# Patient Record
Sex: Male | Born: 1990
Health system: Southern US, Community
[De-identification: ages and names within clinical notes are randomized; demographics above are authoritative.]

## PROBLEM LIST (undated history)

## (undated) DIAGNOSIS — M459 Ankylosing spondylitis of unspecified sites in spine: Secondary | ICD-10-CM

---
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
## 2013-07-29 ENCOUNTER — Telehealth: Payer: Self-pay

## 2013-07-29 NOTE — Telephone Encounter (Signed)
PT'S MOTHER CALLED AND WANTS TO KNOW HOW LONG HER SON NEEDS TO TAKE MALARIA PILLS BEFORE HE LEAVES FOR HIS TRIP TO [place]. Fuller Song RHONDA @ [phone]

## 2013-07-29 NOTE — Telephone Encounter (Signed)
Please advise 

## 2013-07-30 NOTE — Telephone Encounter (Signed)
We have not seen this patient in Epic

## 2013-07-31 NOTE — Telephone Encounter (Signed)
He has not been seen in our office since 10/21/11. Who prescribed medication?

## 2013-07-31 NOTE — Telephone Encounter (Signed)
Left message to return call 

## 2013-08-27 ENCOUNTER — Ambulatory Visit (INDEPENDENT_AMBULATORY_CARE_PROVIDER_SITE_OTHER): Payer: BC Managed Care – PPO | Admitting: Family Medicine

## 2013-08-27 ENCOUNTER — Encounter: Payer: Self-pay | Admitting: Family Medicine

## 2013-08-27 VITALS — BP 118/64 | HR 61 | Temp 98.9°F | Resp 16 | Ht 72.75 in | Wt 181.6 lb

## 2013-08-27 DIAGNOSIS — Z7184 Encounter for health counseling related to travel: Secondary | ICD-10-CM

## 2013-08-27 DIAGNOSIS — Z91030 Bee allergy status: Secondary | ICD-10-CM

## 2013-08-27 DIAGNOSIS — Z7189 Other specified counseling: Secondary | ICD-10-CM

## 2013-08-27 DIAGNOSIS — Z23 Encounter for immunization: Secondary | ICD-10-CM

## 2013-08-27 MED ORDER — CIPROFLOXACIN HCL 500 MG PO TABS: 500.0000 mg | ORAL_TABLET | Freq: Two times a day (BID) | ORAL | Status: DC

## 2013-08-27 MED ORDER — DOXYCYCLINE HYCLATE 100 MG PO CAPS: 100.0000 mg | ORAL_CAPSULE | Freq: Every day | ORAL | Status: DC

## 2013-08-27 MED ORDER — EPINEPHRINE 0.3 MG/0.3ML IJ SOAJ: 0.3000 mg | Freq: Once | INTRAMUSCULAR | Status: DC

## 2013-08-27 MED ORDER — TYPHOID VACCINE PO CPDR: 1.0000 | DELAYED_RELEASE_CAPSULE | ORAL | Status: DC

## 2013-08-27 NOTE — Patient Instructions (Addendum)
Your typhoid vaccine was sent to [address] in the [facility] shopping center [phone].  Take 1 tab of the typhoid vaccine every other day for 4 doses (so will take 7days to complete).  It is effective 1 week after completing course.  Take 1 hour before a meal (on an empty stomach) with a cold or lukewarm drink.  This lasts for about 5 years.  For the Japenese encephalitis vaccine - Call [facility] - the travel clinic in [place] - [phone] to ask if they have it as well as call the [facility] immunization clinic [phone] (normally they have a long wait but maybe have a cancellation next wk).  If you find a [facility] or other pharmacy that has the vaccine, just call us and I will call you in a prescription for it - however, the [facility] [address] we [facility] did not have it.  You have completed your hepatitis B vaccination series and after today you have completed your hepatitis A series as well.  All of your other routine immunizations are up to date.  Start taking the doxycycline for malaria prophylaxis 1-2 days before you leave. Take every day - it will increase your sensitivity to the sun.  Continue taking it until after you have returned home for 4 weeks.  I sent a prescription for ciprofloxacin to your pharmacy.  This is for you to take with you and hold on to. If you get severe abd pains and diarrhea that do not resolve within 1-2 days, try the cipro.  Travelers' Diarrhea Travelers' diarrhea (TD) is the most common illness affecting travelers. Each year many travelers develop diarrhea. TD usually occurs within the first week of travel. However, it may occur at any time while traveling. It may even occur after returning home. The most important risk factor is where you are going. High-risk places are the developing countries of:  [place].  [place].  The [place].  [place]. High risk people include young adults and those  with:  Transplants.  HIV infections.  Medicine that suppresses the immune system.  Inflammatory-bowel disease.  Diabetes.  H-2 blockers or antacids. Attack rates are similar for men and women. The primary source of TD is eating or drinking food or water tainted with feces (stool or bowel movements). CAUSES  Infectious agents are the primary cause of TD. Germs cause almost 80% of TD cases. The most common germ produces:  Watery diarrhea with cramps.  Low-grade or no fever. There are many other bacterial, viral and parasitic pathogens (disease causing "bugs").  SYMPTOMS  Most TD cases begin suddenly. Symptoms include stool that is increased in:  Frequency.  Volume.  Weight. Altered stool consistency also is common. Typically, you have four to five loose or watery bowel movements each day. Other common symptoms are:  Nausea.  Vomiting.  Diarrhea.  Abdominal cramping.  Bloating.  Fever.  Urgency.  Malaise. Most cases are not dangerous. Most cases go away in 1-2 days without treatment. TD is rarely life threatening. 90% of cases resolve within 1 week. 98% resolve within 1 month. PREVENTION   Avoid foods or beverages purchased from street vendors in high risk countries.  Avoid food from places where unclean conditions are present.  Avoid raw or undercooked meat and seafood.  Avoid raw fruits (e.g., oranges, bananas, avocados) and vegetables unless you peel them yourself.  If handled properly, well-cooked and packaged foods usually are safe. Foods associated with  increased risk for TD include:  Tap water.  Ice.  Unpasteurized milk.  Dairy products.  Safe beverages include:  Bottled carbonated beverages.  Hot tea or coffee.  Beer.  Wine.  Boiled water.  Water treated with iodine or chlorine. ANTIBIOTICS ARE NOT RECOMMENDED AS PREVENTION  CDC (Centers for Disease Control) does not recommend antimicrobial drugs (medicine that kill germs) to  prevent TD. Several studies show that Pepto-Bismol taken as either 2 tablets 4 times daily, or 2 fluid ounces 4 times daily, reduces the incidence of travelers' diarrhea. People that should avoid Pepto-Bismol include those who are:  Pregnant.  Allergic to aspirin.  Taking anticoagulants medicine (probenecid, methotrexate).  Be informed about potential side effects, in particular about temporary blackening of the tongue and stool, and rarely ringing in the ears. Because of potential adverse side effects, preventative Pepto-Bismol should not be used for more than 3 weeks.  Some antibiotics taken in a once-a-day dose are 90% effective at preventing travelers' diarrhea. However, antibiotics are not recommended as prevention. Routine antimicrobial prophylaxis increases your risk for:  Adverse reactions.  Infections with resistant organisms.  Antibiotics can increase your susceptibility to resistant bacterial pathogens and provide no protection against either viral or parasitic pathogens. This can give travelers a false sense of security. As a result, strict adherence to preventive measures is encouraged. Pepto-Bismol should be used as an extra effort if prophylaxis is needed. TREATMENT   TD usually is a self-limited disorder. It gets well without treatment. It often goes away without specific treatment. Oral re-hydration is often helpful to replace lost fluids and electrolytes. Clear liquids are routinely recommended for adults. You may be helped with antimicrobial therapy if you develop three or more loose stools in an 8-hour period, especially if associated with:  Nausea.  Vomiting.  Abdominal cramps.  Fever.  Blood in stools.  Antibiotics usually are given for 3-5 days. Pepto-Bismol also may be used as treatment. Take one fluid ounce, or two 262 mg tablets every 30 minutes, for up to 8 doses in a 24-hour period. This can be repeated on a second day. If diarrhea persists despite  therapy, you should be evaluated by a caregiver and treated for possible parasitic infection.  Because drug resistance is a continuing problem and may vary from country to country, professional assistance should be looked for if problems persist.  Antimotility agents (loperamide, diphenoxylate, and paregoric) mostly reduce diarrhea by slowing down the passage of food and drink in the gut. This allows more time for absorption. Some persons believe diarrhea is the body's defense mechanism to minimize contact time between gut pathogens and lining of the bowel. In several studies, antimotility agents have been useful in treating travelers' diarrhea by decreasing the duration of diarrhea. However, these agents should never be used by persons with fever or bloody diarrhea because they can increase the severity of disease by delaying clearance of causative organisms. Because antimotility agents are now available over the counter, their improper use is of concern. Complications have been reported from the use of these medicines such as:  Toxic megacolon.  Sepsis.  Disseminated intravascular coagulation. SEEK IMMEDIATE MEDICAL CARE IF:   You are unable to keep fluids down.  Vomiting or diarrhea becomes persistent.  Abdominal (belly) pain develops or increases or localizes. (Right sided pain can be appendicitis and left sided pain in adults can be diverticulitis).  You develop an oral temperature above 102 F (38.9 C), or as your caregiver suggests.  Diarrhea becomes excessive  or contains blood or mucous.  Excessive weakness, dizziness, fainting or extreme thirst.  Checking weight 2 to 3 times per day in babies and children will help verify adequate fluid replacement. Your caregiver will tell you what loss should concern you or suggest another visit to your personal physician.  Record your weight or your child's weight today. Compare this to your home scale and record all weights and time and date  weighed. Try to check weight at the same times every day. Bring this chart to your caregivers if you or your child needs to be seen again. FOR MORE INFORMATION  Travelers should consult with a caregiver before departing on a trip abroad. Information about TD is available from:  Your local or state health departments.  World Science writer [facility]). Other information that may be of interest to travelers can be found at the [facility] Travelers' Health homepage at QuestDrive.gl. Document Released: 12/06/2002 Document Revised: 03/09/2012 Document Reviewed: 02/23/2009 [facility] Patient Information 2014 [place], [place].  Immunization Information for Foreign Travel Immunizations can protect you from certain diseases. Immunizations can also prevent the spread of certain infections. It is important to see your caregiver or a travel medicine specialist 4 6 weeks before you travel. This allows time for vaccines to take effect. It also provides enough time for you to get vaccines that must be given in a series over a period of days or weeks. Immunizations for travelers include:  Routine vaccines. These vaccines are standard for the people in a country.  Recommended vaccines. These vaccines are recommended before travel to some countries or regions.  Required vaccines. These vaccines are necessary before travel to specific countries or regions. If it is less than 4 weeks before you leave, you should still see your caregiver. You might still benefit from vaccines or medicines. WHAT ARE THE ROUTINE VACCINES? Routine vaccines can protect you from diseases that are common in many parts of the world. Most routine vaccines are given at specific ages during your life. However, routine vaccines also include the annual flu (influenza) vaccine. You should be up-to-date on your routine immunizations before you travel. Your caregiver will be able to review your vaccine history and determine whether you have had  all the routine vaccines. You may be advised to get extra doses or booster vaccines even if you are up-to-date on the routine vaccines. WHAT ARE THE RECOMMENDED VACCINES? Know your travel schedule when you visit your caregiver. The vaccines recommended before foreign travel will depend on several factors, including:  The country or countries of travel.  Whether you will travel to rural areas.  The length of time you will be traveling.  The season of the year.  Your age.  Your health status.  Your previous immunizations. Vaccine recommendations change over time. Your caregiver can tell you what vaccines are recommended before your trip. The annual influenza vaccine sometimes differs for the [place] and [place] hemispheres. Unless the annual vaccines are the same in both hemispheres, people with certain chronic medical conditions who are traveling to the other hemisphere shortly before or during the influenza season should also get the other influenza vaccine. The other influenza vaccine should be obtained either before leaving the country or shortly after arrival at the travel site. WHAT ARE THE REQUIRED VACCINES? Vaccines may be required during a current outbreak of an infectious disease in a country or region. Your caregiver will be able to tell you about any current outbreaks and required vaccines. For example, proof of yellow  fever immunization is currently required for most people before traveling to certain countries in [place] and [place]. This vaccine can only be obtained at approved centers. You should get the yellow fever vaccine at least 10 days before your trip. After 10 days, most people show immunity to yellow fever. If it has been longer than 10 years since you received the yellow fever vaccine, another dose is required. If proof of immunization is incomplete or inaccurate, you could be quarantined, denied entry, or given another dose of vaccine at the travel site. If you  cannot receive the yellow fever vaccine because of medical reasons, you must have a written statement from your caregiver. The statement must contain a medical reason for the lack of immunization. In such a case, your caregiver should then give you advice on how to decrease your chance of getting yellow fever. That advice should include taking precautions to avoid mosquito bites and limiting outdoor time. Other than having a medical condition or being under the age of 6 months, no other reasons will be accepted for not getting the vaccine.  Proof of meningococcal immunization is required by the [place] of Health for any person older than 2 years who is taking part in the [place] or [place]. Visas for traveling to the hajj or [place] will not even be issued until there is proof of immunization. You should get this vaccine at least 10 days before your trip. After 10 days, most people show immunity. If it has been longer than 3 years since your last immunization, another dose is required. FOR MORE INFORMATION  Centers for Disease Control and Prevention (CDC): FootballExhibition.com.br  World Health Organization [facility]): https://castaneda-walker.com/ Document Released: 12/04/2009 Document Revised: 12/02/2012 Document Reviewed: 11/13/2012 [facility] Patient Information 2014 [place], [place].

## 2013-08-27 NOTE — Progress Notes (Signed)
Subjective:    Patient ID: David Frye, male    DOB: [birth year], 22 y.o.   MRN: [id number] Chief Complaint  Patient presents with  . Annual Exam    HPI  Traveling to [place] - leaving on 9/8 for 90 d on his own.  Doesn't really have a plan - just going to travel and explore.  Quit his job recently to do this.  Had significant swelling with a bee sting previously so had been rx'ed an epipen which expired and he never had to use. It was mainly given to him in case he was stung on the neck or face.  Requests renewal off this.  History reviewed. No pertinent past medical history. No current outpatient prescriptions on file prior to visit.   No current facility-administered medications on file prior to visit.   No Known Allergies History reviewed. No pertinent past surgical history. History reviewed. No pertinent family history. History   Social History  . Marital Status: Married    Spouse Name: N/A    Number of Children: N/A  . Years of Education: N/A   Occupational History  . Food Industry    Social History Main Topics  . Smoking status: Former Games developer  . Smokeless tobacco: None  . Alcohol Use: Yes     Comment: 15 drinks a week  . Drug Use: No  . Sexual Activity: Yes   Other Topics Concern  . None   Social History Narrative   Single. Education: [employer]. Exercise: Biking.     Review of Systems  Constitutional: Negative.   HENT: Negative.   Eyes: Negative.   Respiratory: Negative.   Cardiovascular: Negative.   Gastrointestinal: Negative.   Endocrine: Negative.   Genitourinary: Negative.   Musculoskeletal: Negative.   Skin: Negative.   Allergic/Immunologic: Negative.   Neurological: Negative.   Hematological: Negative.   Psychiatric/Behavioral: Negative.       BP 118/64  Pulse 61  Temp(Src) 98.9 F (37.2 C) (Oral)  Resp 16  Ht 6' 0.75" (1.848 m)  Wt 181 lb 9.6 oz (82.373 kg)  BMI 24.12 kg/m2  SpO2 100% Objective:   Physical Exam   Constitutional: He is oriented to person, place, and time. He appears well-developed and well-nourished. No distress.  HENT:  Head: Normocephalic and atraumatic.  Eyes: No scleral icterus.  Pulmonary/Chest: Effort normal.  Neurological: He is alert and oriented to person, place, and time.  Skin: Skin is warm and dry. He is not diaphoretic.  Psychiatric: He has a normal mood and affect. His behavior is normal.      Assessment & Plan:  Travel advice encounter Malaria prophylaxis with doxy 1d before and for 28d after returning. Start oral typhoid vaccine now - see pt instructions Cipro prn traveler's diarrhea Last Td was 2005 so TdaP given today Flu shot today 2nd Hep A today - first was given in 2007 so course now complete, has completed all 3 doses for hep B in 2005 and 2 doses of varicella.  Had meningococcal vaccine around 2007. Call other clinics to try to get Japanese encephalitis vaccine.  Bee sting allergy - epipen refilled. Meds ordered this encounter  Medications  . EPINEPHrine (EPIPEN) 0.3 mg/0.3 mL SOAJ injection    Sig: Inject 0.3 mLs (0.3 mg total) into the muscle once.    Dispense:  1 Device    Refill:  1  . doxycycline (VIBRAMYCIN) 100 MG capsule    Sig: Take 1 capsule (100 mg total) by mouth daily.  Dispense:  120 capsule    Refill:  0  . DISCONTD: typhoid (VIVOTIF BERNA VACCINE) DR capsule    Sig: Take 1 capsule by mouth every other day.    Dispense:  4 capsule    Refill:  0  . typhoid (VIVOTIF BERNA VACCINE) DR capsule    Sig: Take 1 capsule by mouth every other day.    Dispense:  4 capsule    Refill:  0  . ciprofloxacin (CIPRO) 500 MG tablet    Sig: Take 1 tablet (500 mg total) by mouth 2 (two) times daily. For 5 days    Dispense:  20 tablet    Refill:  0

## 2016-10-17 ENCOUNTER — Ambulatory Visit (INDEPENDENT_AMBULATORY_CARE_PROVIDER_SITE_OTHER): Payer: BC Managed Care – PPO | Admitting: Physician Assistant

## 2016-10-17 ENCOUNTER — Ambulatory Visit (INDEPENDENT_AMBULATORY_CARE_PROVIDER_SITE_OTHER): Payer: BC Managed Care – PPO

## 2016-10-17 ENCOUNTER — Encounter: Payer: Self-pay | Admitting: Physician Assistant

## 2016-10-17 VITALS — BP 116/74 | HR 62 | Temp 97.8°F | Resp 17 | Ht 72.75 in | Wt 206.0 lb

## 2016-10-17 DIAGNOSIS — M25561 Pain in right knee: Secondary | ICD-10-CM

## 2016-10-17 DIAGNOSIS — M7052 Other bursitis of knee, left knee: Secondary | ICD-10-CM | POA: Diagnosis not present

## 2016-10-17 MED ORDER — MELOXICAM 7.5 MG PO TABS: 7.5000 mg | ORAL_TABLET | Freq: Every day | ORAL | 1 refills | Status: DC

## 2016-10-17 NOTE — Progress Notes (Signed)
David Frye  MRN: [id number] DOB: 1991-12-16  PCP: No primary care provider on file.  Subjective:  Pt is a 25 year old male presents to clinic for left knee swelling x three days. Pain is located on the outside of his left knee, pain does not radiate. Hurts more with squatting. Does not feel unstable. No history of MOI, no history of sports. He likes to hike.  He iced it and elevated it last night, which helped.  He is going out of the country for an indefinite amount of time and would like to make sure there is nothing wrong with it.   Pain when squats down.   Notes he stopped eating dairy three weeks ago. He started eating it again a few days ago, which is when the pain returned.   Review of Systems  Constitutional: Negative for activity change, chills, diaphoresis, fatigue and fever.  Respiratory: Negative for cough, chest tightness and shortness of breath.   Cardiovascular: Negative for chest pain and palpitations.  Gastrointestinal: Negative for diarrhea, nausea and vomiting.  Genitourinary: Negative for discharge, dysuria and flank pain.  Musculoskeletal: Positive for arthralgias (left knee) and joint swelling (left knee). Negative for gait problem.  Skin: Negative for color change, rash and wound.  Neurological: Negative for dizziness, light-headedness and headaches.    There are no active problems to display for this patient.   No current outpatient prescriptions on file prior to visit.   No current facility-administered medications on file prior to visit.     No Known Allergies  Objective:  BP 116/74 (BP Location: Right Arm, Patient Position: Sitting, Cuff Size: Large)   Pulse 62   Temp 97.8 F (36.6 C) (Oral)   Resp 17   Ht 6' 0.75" (1.848 m)   Wt 206 lb (93.4 kg)   SpO2 99%   BMI 27.37 kg/m   Physical Exam  Constitutional: He is oriented to person, place, and time and well-developed, well-nourished, and in no distress. No distress.  Cardiovascular:  Normal rate, regular rhythm and normal heart sounds.   Pulmonary/Chest: Effort normal. No respiratory distress.  Musculoskeletal:       Left knee: He exhibits effusion (mild, lateral left knee. No patella ballottment ). He exhibits normal range of motion, no deformity, normal alignment, normal meniscus and no MCL laxity. No medial joint line, no lateral joint line, no MCL, no LCL and no patellar tendon tenderness noted.  Neurological: He is alert and oriented to person, place, and time. GCS score is 15.  Skin: Skin is warm and dry.  Psychiatric: Mood, memory, affect and judgment normal.  Vitals reviewed.  Dg Knee Complete 4 Views Left  Result Date: 10/17/2016 CLINICAL DATA:  Knee pain.  No known injury.  Initial evaluation. EXAM: LEFT KNEE - COMPLETE 4+ VIEW COMPARISON:  No recent prior. FINDINGS: No acute bony or joint abnormality identified. No evidence of fracture or dislocation. IMPRESSION: No acute abnormality. Electronically Signed   By: Maisie Fus  Register   On: 10/17/2016 09:13    Assessment and Plan :  1. Right knee pain, unspecified chronicity 2. Bursitis of left knee, unspecified bursa - DG Knee Complete 4 Views Left - meloxicam (MOBIC) 7.5 MG tablet; Take 1 tablet (7.5 mg total) by mouth daily.  Dispense: 30 tablet; Refill: 1 - Supportive care: Rest, ice, compression and elevation. Discussed with patient trial of no dairy, as he thinks there is a possible association. RTC if no improvement/worksening symptoms. Consider imaging at that time.  Marco CollieWhitney Anice Wilshire, PA-C  [facility] 10/17/2016 8:17 AM

## 2016-10-17 NOTE — Patient Instructions (Addendum)
If you continue to experience knee swelling, please use rest, ice, compression with an ace wrap and elevation. Mobic is an anti-inflammatory medication. You can take one or two, depending on how you feel. One in the morning and one at night; or two in the morning or at night. This will help you reduce inflammation.  Try keeping off the dairy. This can cause joint pain and swelling in some patients.    Thank you for coming in today. I hope you feel we met your needs.  Feel free to call [facility] if you have any questions or further requests.  Please consider signing up for MyChart if you do not already have it, as this is a great way to communicate with me.  Best,  Whitney McVey, PA-C  IF you received an x-ray today, you will receive an invoice from [facility]. Please contact [facility] at [phone] with questions or concerns regarding your invoice.   IF you received labwork today, you will receive an invoice from [employer]. Please contact [facility] at [phone] with questions or concerns regarding your invoice.   Our billing staff will not be able to assist you with questions regarding bills from these companies.  You will be contacted with the lab results as soon as they are available. The fastest way to get your results is to activate your My Chart account. Instructions are located on the last page of this paperwork. If you have not heard from [place] regarding the results in 2 weeks, please contact this office.     HOME CARE INSTRUCTIONS  Take medicines only as directed by your health care provider.  Rest your knee and keep it raised (elevated) while you are resting.  Do not do things that cause or worsen pain.  Avoid high-impact activities or exercises, such as running, jumping rope, or doing jumping jacks.  Apply ice to the knee area:  Put ice in a plastic bag.  Place a towel between your skin and the bag.  Leave the ice on for 20  minutes, 2-3 times a day.  Ask your health care provider if you should wear an elastic knee support.  Keep a pillow under your knee when you sleep.  Lose weight if you are overweight. Extra weight can put pressure on your knee.  Do not use any tobacco products, including cigarettes, chewing tobacco, or electronic cigarettes. If you need help quitting, ask your health care provider. Smoking may slow the healing of any bone and joint problems that you may have. SEEK MEDICAL CARE IF:  Your knee pain continues, changes, or gets worse.  You have a fever along with knee pain.  Your knee buckles or locks up.  Your knee becomes more swollen. SEEK IMMEDIATE MEDICAL CARE IF:   Your knee joint feels hot to the touch.  You have chest pain or trouble breathing.   This information is not intended to replace advice given to you by your health care provider. Make sure you discuss any questions you have with your health care provider.

## 2017-03-11 ENCOUNTER — Ambulatory Visit (INDEPENDENT_AMBULATORY_CARE_PROVIDER_SITE_OTHER): Payer: BC Managed Care – PPO

## 2017-03-11 ENCOUNTER — Ambulatory Visit (INDEPENDENT_AMBULATORY_CARE_PROVIDER_SITE_OTHER): Payer: BC Managed Care – PPO | Admitting: Family Medicine

## 2017-03-11 VITALS — BP 118/58 | HR 64 | Temp 97.5°F | Ht 72.75 in | Wt 178.4 lb

## 2017-03-11 DIAGNOSIS — M533 Sacrococcygeal disorders, not elsewhere classified: Secondary | ICD-10-CM | POA: Diagnosis not present

## 2017-03-11 DIAGNOSIS — M25472 Effusion, left ankle: Secondary | ICD-10-CM

## 2017-03-11 DIAGNOSIS — M25462 Effusion, left knee: Secondary | ICD-10-CM

## 2017-03-11 DIAGNOSIS — M25449 Effusion, unspecified hand: Secondary | ICD-10-CM | POA: Diagnosis not present

## 2017-03-11 DIAGNOSIS — M2550 Pain in unspecified joint: Secondary | ICD-10-CM

## 2017-03-11 DIAGNOSIS — W57XXXS Bitten or stung by nonvenomous insect and other nonvenomous arthropods, sequela: Secondary | ICD-10-CM | POA: Diagnosis not present

## 2017-03-11 MED ORDER — MELOXICAM 7.5 MG PO TABS: 7.5000 mg | ORAL_TABLET | Freq: Every day | ORAL | 1 refills | Status: DC

## 2017-03-11 NOTE — Progress Notes (Signed)
Subjective:  By signing my name below, I, David Frye, attest that this documentation has been prepared under the direction and in the presence of David StaggersJeffrey Joseph Johns, MD. Electronically Signed: Stann Oresung-Kai Frye, Scribe. 03/11/2017 , 12:01 PM .  Patient was seen in Room 8 .   Patient ID: David Frye, male    DOB: 07/08/1991, 26 y.o.   MRN: [id number] Chief Complaint  Patient presents with  . Knee Pain    X 4 mth left knee pain   HPI David Frye is a 26 y.o. male  Patient reports having left knee issues and radiates up to his left hip and down his left ankle. He states this issue started in October 2017, with left knee swelling. He denies any slips or falls that stood out in his memory. He went to [place] the past 4 [place], and worked on a pearl farm everyday on a boat, being on his feet daily. He hasn't rested much. In January, he had fluid aspirated from his left knee and fluid tested, with results showing no crystals or gout in fluid. He also had a MRI done in January, with results looking normal without injuries. He was just informed there was inflammation without any true tears. He has MRI result on a CD with him today. He noticed the swelling in his knee was reduced after the fluid was withdrawn.   Left Knee He reports his knee is painful and stiff, as well as limping, but improves as the day goes on. He has difficulty bending down or squatting down fully. He is able to stand and walk without difficulty. He does note knee giving way once only. He's taken meloxicam in October for a month but without any relief. He also received another month's worth in January. He's been taking advil and that seemed to help the pain more than meloxicam. He hasn't taken advil in a week.   Left ankle His left ankle has been sore, roughly within a month after knee issue started, with very light swelling. His left hip soreness started when his ankle issue started. He denies any ankle sprains in  the past. He denies doing any sports in the past. He's skateboarded when he was younger.   Fingers He also reports his finger joints hurting and feeling slightly swollen after working on the pearl farm. 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 He would wear gloves when cleaning, but it would go through the gloves and would still cause some swelling.   Ticks His family dog also had Lyme disease. He's been bit by ticks while [place], but without noticing any bulls-eye rash or other rash.   There are no active problems to display for this patient.  History reviewed. No pertinent past medical history. History reviewed. No pertinent surgical history. No Known Allergies Prior to Admission medications   Medication Sig Start Date End Date Taking? Authorizing Provider  meloxicam (MOBIC) 7.5 MG tablet Take 1 tablet (7.5 mg total) by mouth daily. 10/17/16   Madelaine BhatElizabeth Whitney McVey, PA-C   Social History   Social History  . Marital status: Married    Spouse name: N/A  . Number of children: N/A  . Years of education: N/A   Occupational History  . Food Industry    Social History Main Topics  . Smoking status: Former Games developermoker  . Smokeless tobacco: Never Used  . Alcohol use Yes  Comment: 15 drinks a week  . Drug use: No  . Sexual activity: Yes   Other Topics Concern  . Not on file   Social History Narrative   Single. Education: [employer]. Exercise: Biking.   Review of Systems  Constitutional: Negative for fatigue and unexpected weight change.  Eyes: Negative for visual disturbance.  Respiratory: Negative for cough, chest tightness and shortness of breath.   Cardiovascular: Negative for chest pain, palpitations and leg swelling.  Gastrointestinal: Negative for abdominal pain and blood in stool.  Musculoskeletal: Positive for arthralgias (left knee pain) and joint swelling. Negative for gait problem and  myalgias.  Neurological: Negative for dizziness, light-headedness and headaches.       Objective:   Physical Exam  Constitutional: He is oriented to person, place, and time. He appears well-developed and well-nourished. No distress.  HENT:  Head: Normocephalic and atraumatic.  Eyes: EOM are normal. Pupils are equal, round, and reactive to light.  Neck: Neck supple.  Cardiovascular: Normal rate.   Pulmonary/Chest: Effort normal. No respiratory distress.  Musculoskeletal: Normal range of motion.  Hands: multiple areas of joint swelling of the PIP's 3rd, 4th and 5th of both hands, IP of the left thumb, there is some DIP swelling of the 2nd 3rd and 4th on the right, 2nd 3rd 4th and 5th on the left, there is some lack of extension of the 5th phalanx PIP bilaterally, ROM equal at other joints Left ankle: some swelling/effusion of left ankle, no bony tenderness, ROM intact, NVI distally Left Knee: 2+ effusion, slight warmth and tenderness over patellar greater lateral than medial, full extension, 90 degrees of flexion, negative varus, negative valgus, negative lachman, pain with mcmurray testing Right knee: full ROM, minimal crepitance, negative lachman, negative mcmurray Left hip: tender over left SI joint, lumbar joint non tender  Neurological: He is alert and oriented to person, place, and time.  Skin: Skin is warm and dry.  Psychiatric: He has a normal mood and affect. His behavior is normal.  Nursing note and vitals reviewed.   Vitals:   03/11/17 1111 03/11/17 1116  BP: (!) 116/56 (!) 118/58  Pulse: 64   Temp: 97.5 F (36.4 C)   TempSrc: Oral   SpO2: 99%   Weight: 178 lb 6.4 oz (80.9 kg)   Height: 6' 0.75" (1.848 m)   Dg Si Joints  Result Date: 03/11/2017 CLINICAL DATA:  SACROILIAC PAIN. EXAM: RIGHT SACROILIAC JOINT - 1+ VIEW COMPARISON:  None. FINDINGS: The sacroiliac joint space IS maintained and there is no evidence of arthropathy. No other bone abnormalities are seen.  IMPRESSION: Negative. Electronically Signed   By: Signa Kell M.D.   On: 03/11/2017 13:16   Dg Knee Complete 4 Views Left  Result Date: 03/11/2017 CLINICAL DATA:  Left knee pain. EXAM: LEFT KNEE - COMPLETE 4+ VIEW COMPARISON:  10/17/2016 FINDINGS: Joint spaces are maintained. No joint effusion. No acute bony abnormality. Specifically, no fracture, subluxation, or dislocation. Soft tissues are intact. IMPRESSION: No acute bony abnormality. Electronically Signed   By: Charlett Nose M.D.   On: 03/11/2017 12:38        Assessment & Plan:   Ashrith Sagan is a 26 y.o. male Polyarthralgia Swelling of ankle joint, left  Swelling of hand joint, unspecified laterality Tick bite, sequela - Plan: Lyme Ab/Western Blot Reflex Sacro ilial pain - Plan: DG Si Joints Swelling of joint, knee, left   - Plan: Rheumatoid factor, Uric acid, Sedimentation rate, Antistreptolysin O titer, DG Knee Complete 4  Views Left, Comprehensive metabolic panel, Lyme Ab/Western Blot Reflex, C-reactive protein, CANCELED: Cyclic Citrul Peptide Antibody, IGG, CANCELED: ANA, IFA Comprehensive Panel  - Initially left knee swelling without known injury, reportedly had negative workup with aspiration for crystals and MRI without acute findings. Now with multiple other joint involvement including left ankle, SI pain, and multiple inter-phalangeal joints of bilateral hands. Symmetrical characteristics suspicious for rheumatoid disease. However he does give history of previous tick bite. Lyme arthritis also in differential. X-ray of SI joints and knee without acute findings. Did have some slight dry skin, without other typical rash of psoriasis. However psoriatic arthritis also in differential.  - Check Lyme titer, then Western blot for confirmation if needed.  -Check rheumatoid factor, ANA, sedimentation rate, ASO titer, CRP for other rheumatologic causes.  -Anticipate referral to rheumatology, but will proceed with initial workup as  above.  -Meloxicam 7.5-15 mg daily when necessary. No work note needed at this time.    Meds ordered this encounter  Medications  . meloxicam (MOBIC) 7.5 MG tablet    Sig: Take 1-2 tablets (7.5-15 mg total) by mouth daily.    Dispense:  30 tablet    Refill:  1   Patient Instructions    I will check for various causes of swollen joints. Depending on the lab results, we'll likely refer you to rheumatology. For now can take meloxicam 1-2 pills per day. Do not combine this with any other over-the-counter anti-inflammatories such as ibuprofen, but okay to additionally take Tylenol if needed.   Return to the clinic or go to the nearest emergency room if any of your symptoms worsen or new symptoms occur.    IF you received an x-ray today, you will receive an invoice from [facility]. Please contact [facility] at [phone] with questions or concerns regarding your invoice.   IF you received labwork today, you will receive an invoice from [place]. Please contact [facility] at [phone] with questions or concerns regarding your invoice.   Our billing staff will not be able to assist you with questions regarding bills from these companies.  You will be contacted with the lab results as soon as they are available. The fastest way to get your results is to activate your My Chart account. Instructions are located on the last page of this paperwork. If you have not heard from [place] regarding the results in 2 weeks, please contact this office.       I personally performed the services described in this documentation, which was scribed in my presence. The recorded information has been reviewed and considered for accuracy and completeness, addended by me as needed, and agree with information above.  Signed,   David Staggers, MD Primary Care at [facility].  03/12/17 1:08 PM

## 2017-03-11 NOTE — Patient Instructions (Addendum)
  I will check for various causes of swollen joints. Depending on the lab results, we'll likely refer you to rheumatology. For now can take meloxicam 1-2 pills per day. Do not combine this with any other over-the-counter anti-inflammatories such as ibuprofen, but okay to additionally take Tylenol if needed.   Return to the clinic or go to the nearest emergency room if any of your symptoms worsen or new symptoms occur.    IF you received an x-ray today, you will receive an invoice from [facility]. Please contact [facility] at [phone] with questions or concerns regarding your invoice.   IF you received labwork today, you will receive an invoice from [facility]. Please contact [facility] at [phone] with questions or concerns regarding your invoice.   Our billing staff will not be able to assist you with questions regarding bills from these companies.  You will be contacted with the lab results as soon as they are available. The fastest way to get your results is to activate your My Chart account. Instructions are located on the last page of this paperwork. If you have not heard from us regarding the results in 2 weeks, please contact this office.

## 2017-03-13 LAB — COMPREHENSIVE METABOLIC PANEL
ALBUMIN: 4.6 g/dL (ref 3.5–5.5)
ALK PHOS: 85 IU/L (ref 39–117)
ALT: 10 IU/L (ref 0–44)
AST: 9 IU/L (ref 0–40)
Albumin/Globulin Ratio: 1.6 (ref 1.2–2.2)
BILIRUBIN TOTAL: 0.4 mg/dL (ref 0.0–1.2)
BUN / CREAT RATIO: 14 (ref 9–20)
BUN: 11 mg/dL (ref 6–20)
CHLORIDE: 98 mmol/L (ref 96–106)
CO2: 26 mmol/L (ref 18–29)
Calcium: 9.7 mg/dL (ref 8.7–10.2)
Creatinine, Ser: 0.76 mg/dL (ref 0.76–1.27)
GFR calc Af Amer: 146 mL/min/{1.73_m2} (ref >59)
GFR calc non Af Amer: 126 mL/min/{1.73_m2} (ref >59)
GLOBULIN, TOTAL: 2.9 g/dL (ref 1.5–4.5)
Glucose: 79 mg/dL (ref 65–99)
Potassium: 4.2 mmol/L (ref 3.5–5.2)
SODIUM: 140 mmol/L (ref 134–144)
Total Protein: 7.5 g/dL (ref 6.0–8.5)

## 2017-03-13 LAB — LYME AB/WESTERN BLOT REFLEX: LYME DISEASE AB, QUANT, IGM: <0.8 index (ref 0.00–0.79)

## 2017-03-13 LAB — C-REACTIVE PROTEIN: CRP: 25.1 mg/L — ABNORMAL HIGH (ref 0.0–4.9)

## 2017-03-13 LAB — ANTISTREPTOLYSIN O TITER: ASO: 59 IU/mL (ref 0.0–200.0)

## 2017-03-13 LAB — ANA: ANA TITER 1: NEGATIVE

## 2017-03-13 LAB — URIC ACID: URIC ACID: 6 mg/dL (ref 3.7–8.6)

## 2017-03-13 LAB — SEDIMENTATION RATE: Sed Rate: 20 mm/hr — ABNORMAL HIGH (ref 0–15)

## 2017-03-13 LAB — RHEUMATOID FACTOR

## 2017-03-13 LAB — CYCLIC CITRUL PEPTIDE ANTIBODY, IGG/IGA: CYCLIC CITRULLIN PEPTIDE AB: 3 U (ref 0–19)

## 2017-03-14 ENCOUNTER — Other Ambulatory Visit: Payer: Self-pay | Admitting: Family Medicine

## 2017-03-14 DIAGNOSIS — M2550 Pain in unspecified joint: Secondary | ICD-10-CM

## 2017-03-19 ENCOUNTER — Encounter: Payer: Self-pay | Admitting: Family Medicine

## 2018-03-05 IMAGING — DX DG KNEE COMPLETE 4+V*L*
4 series · 4 of 4 positions shown · non-contrast
Comparison: 10/17/2016

CLINICAL DATA: Left knee pain.

EXAM:
LEFT KNEE - COMPLETE 4+ VIEW

[knee ap]
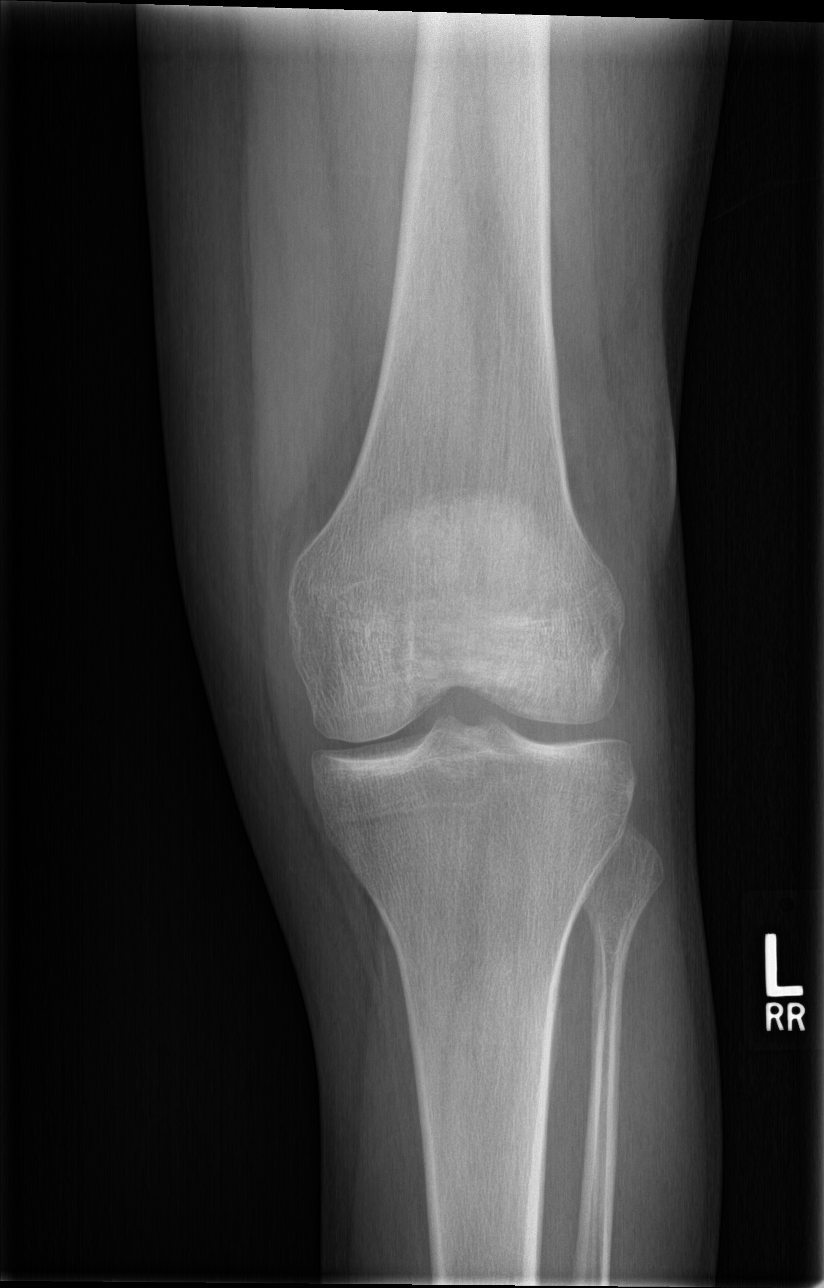

[knee obl (1 of 2)]
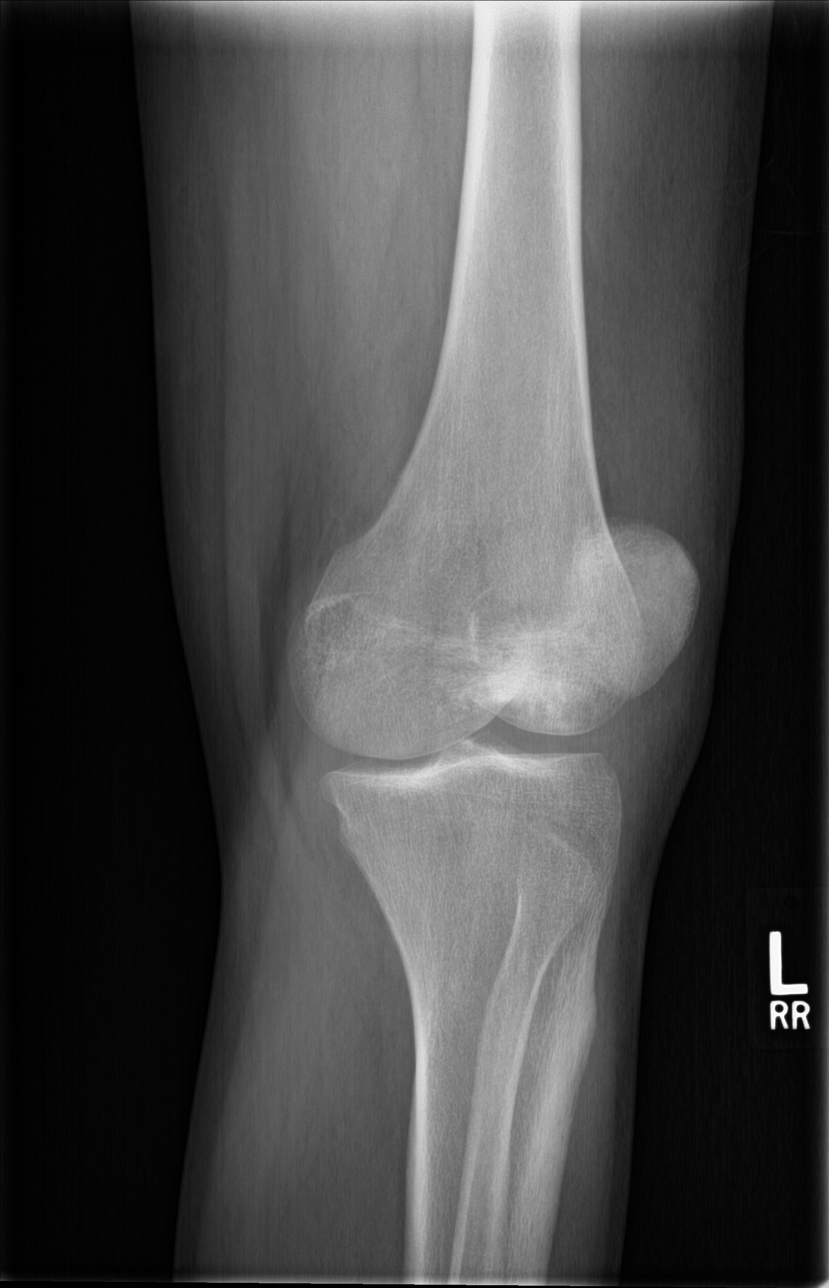

[knee obl (2 of 2)]
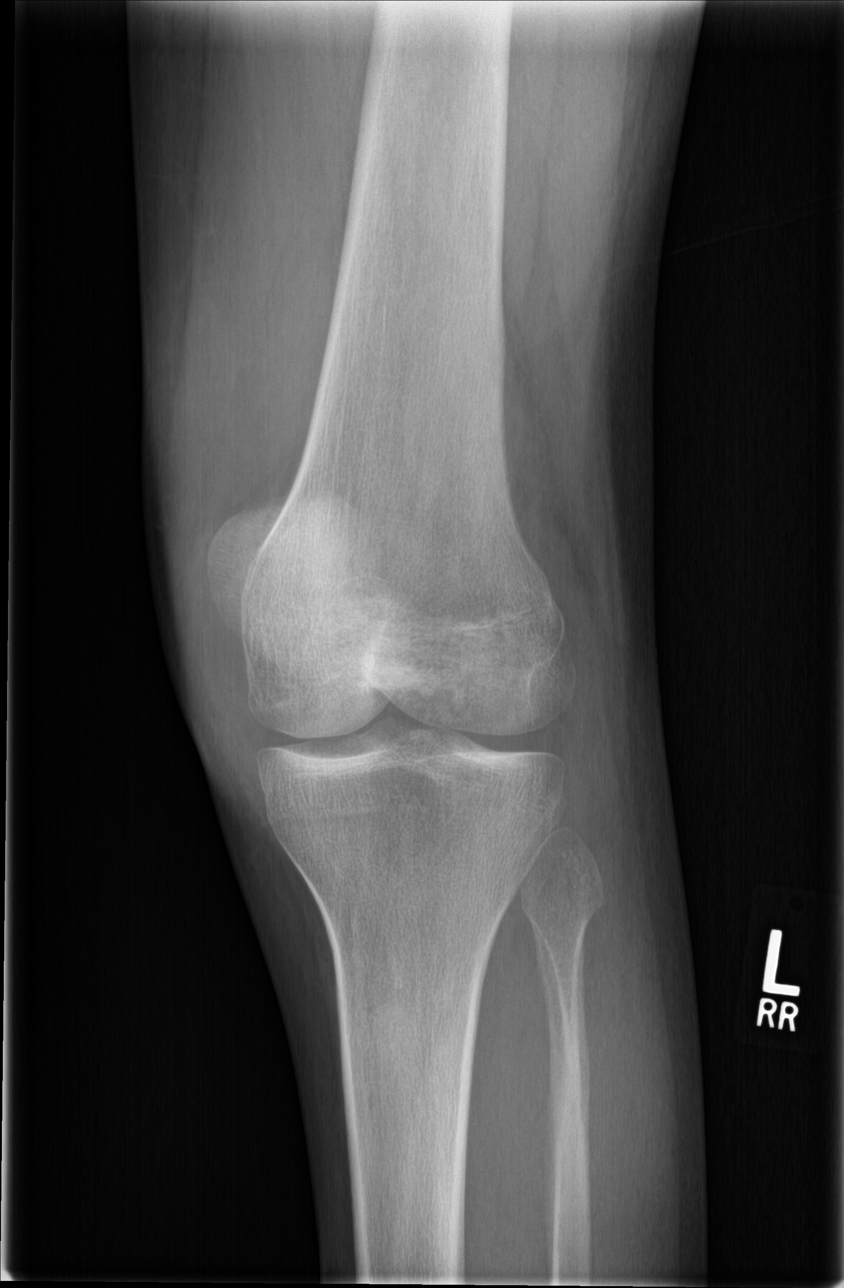

[knee lat]
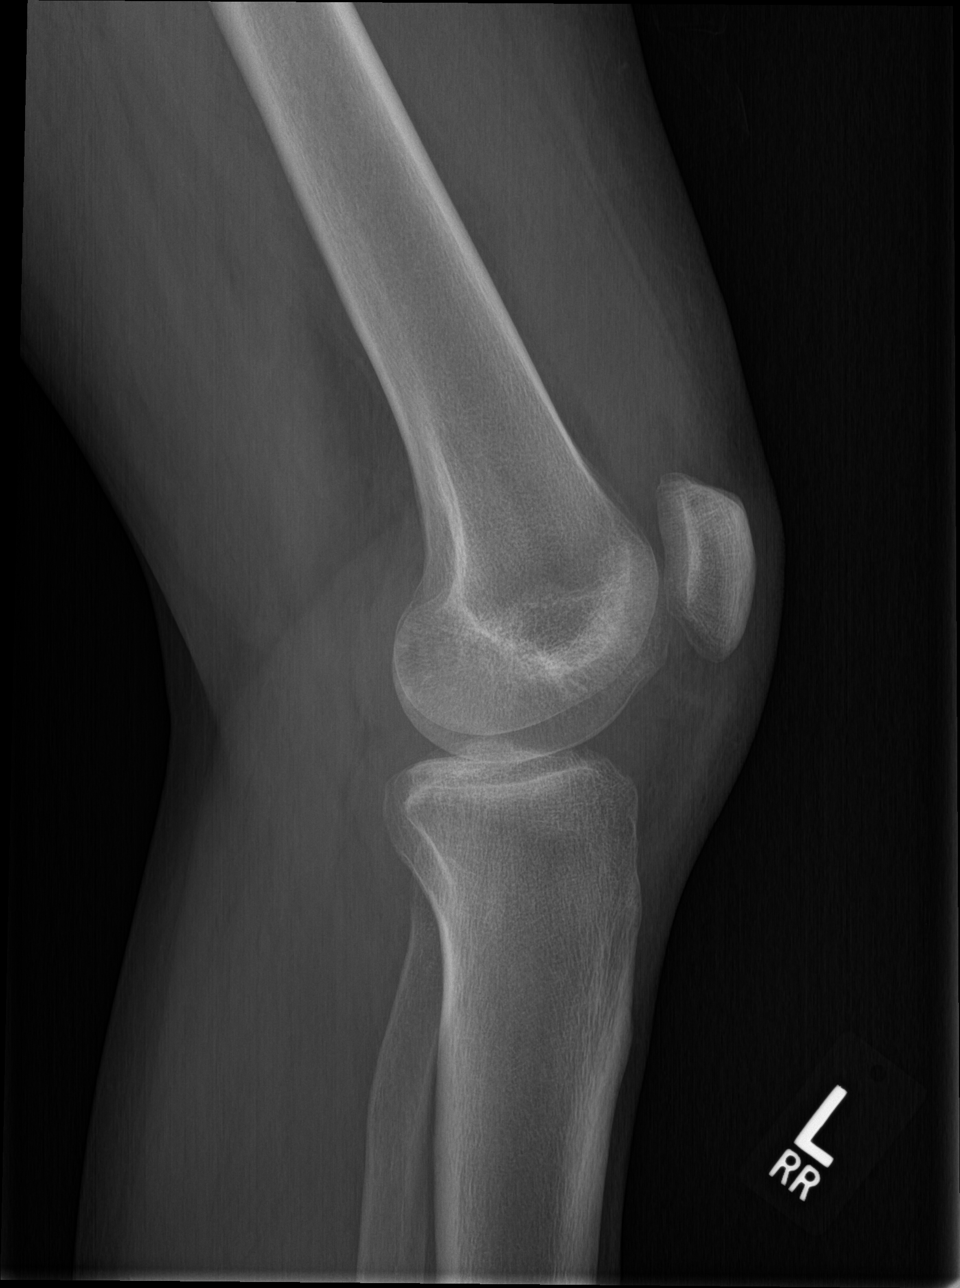

[4 of 4 positions shown; findings below may reference images not displayed]

FINDINGS: Joint spaces are maintained. No joint effusion. No acute bony
abnormality. Specifically, no fracture, subluxation, or dislocation.
Soft tissues are intact.
IMPRESSION: No acute bony abnormality.

## 2018-03-16 ENCOUNTER — Other Ambulatory Visit: Payer: Self-pay

## 2018-03-16 ENCOUNTER — Encounter: Payer: Self-pay | Admitting: Family Medicine

## 2018-03-16 ENCOUNTER — Ambulatory Visit: Payer: BLUE CROSS/BLUE SHIELD | Admitting: Family Medicine

## 2018-03-16 VITALS — BP 124/62 | HR 59 | Temp 98.2°F | Resp 18 | Ht 72.75 in | Wt 177.6 lb

## 2018-03-16 DIAGNOSIS — M47899 Other spondylosis, site unspecified: Secondary | ICD-10-CM | POA: Diagnosis not present

## 2018-03-16 NOTE — Patient Instructions (Addendum)
I would discuss diet effect on the the HLA B27 spondyloarthropathy, and options for meds with rheumatology. I am happy to refer you to another rheumatologist to get another opinion.  Let me know who you would like to see by Mychart and I will send in a referral.    IF you received an x-ray today, you will receive an invoice from [facility]. Please contact [facility] at [phone] with questions or concerns regarding your invoice.   IF you received labwork today, you will receive an invoice from [place]. Please contact [facility] at [phone] with questions or concerns regarding your invoice.   Our billing staff will not be able to assist you with questions regarding bills from these companies.  You will be contacted with the lab results as soon as they are available. The fastest way to get your results is to activate your My Chart account. Instructions are located on the last page of this paperwork. If you have not heard from [place] regarding the results in 2 weeks, please contact this office.

## 2018-03-16 NOTE — Progress Notes (Signed)
Subjective:  By signing my name below, I, David Frye, attest that this documentation has been prepared under the direction and in the presence of David Agreste, MD Electronically Signed: Ladene Frye, David Frye 03/16/2018 at 4:58 PM.   Patient ID: David Frye, male    DOB: 1991/10/26, 27 y.o.   MRN: [id number]  Chief Complaint  Patient presents with  . questions    would like to discuss an auto immune disease he was dx with    HPI David Frye is a 27 y.o. male who presents to Primary Care at [facility] for medical advice. Pt has been followed by Dr. 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 Dad has a h/o psoriasis.  Pt states that he is concerned since he was never diagnosed with anything specific and Dr. Lenna Gilford never looked into what was causing the reaction, he was just told to take Humira for the rest of his life which has a lot of side-effects.  Pt states that he did a lot of research on autoimmune diseases which suggested that decreasing stress and a "clean diet" without diary or gluten could improve his symptoms. Pt states that he was doing Whole 30 for 60 days prior to his reaction. He then had pizza after completing his 60 days and woke up the following day with l ankle swelling. Pt states that he then went to [place] and ate pizza again and noticed even more swelling. 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 Also states that he saw a physician in [place] and was given NSAIDs. Over the past 4 weeks, he has gone on an "allergen free diet" as he is interested in stopping Humira. He is due for another injection today. Pt also states that he has not had dairy in a yr but still had some swelling with eating gluten, even with being on Humira. He denies joint swelling or pain, even with exercising frequently.  There are no active  problems to display for this patient.  No past medical history on file. No past surgical history on file. No Known Allergies Prior to Admission medications   Medication Sig Start Date End Date Taking? Authorizing Provider  HUMIRA PEN 40 MG/0.8ML PNKT  02/25/18  Yes [provider]  meloxicam (MOBIC) 7.5 MG tablet Take 1-2 tablets (7.5-15 mg total) by mouth daily. Patient not taking: Reported on 03/16/2018 03/11/17   David Agreste, MD   Social History   Socioeconomic History  . Marital status: Married    Spouse name: Not on file  . Number of children: Not on file  . Years of education: Not on file  . Highest education level: Not on file  Social Needs  . Financial resource strain: Not on file  . Food insecurity - worry: Not on file  . Food insecurity - inability: Not on file  . Transportation needs - medical: Not on file  . Transportation needs - non-medical: Not on file  Occupational History  . Occupation: Human resources officer  Tobacco Use  . Smoking status: Former Research scientist (life sciences)  . Smokeless tobacco: Never Used  Substance and Sexual Activity  . Alcohol use: Yes    Comment: 15 drinks a week  . Drug use: No  . Sexual activity: Yes  Other Topics Concern  . Not on file  Social History Narrative   Single. Education: [employer]. Exercise: Biking.  Review of Systems  Musculoskeletal: Negative for arthralgias (resolved) and joint swelling (resolved).      Objective:   Physical Exam  Constitutional: He is oriented to person, place, and time. He appears well-developed and well-nourished. No distress.  HENT:  Head: Normocephalic and atraumatic.  Eyes: Conjunctivae and EOM are normal.  Neck: Neck supple. No tracheal deviation present.  Cardiovascular: Normal rate.  Pulmonary/Chest: Effort normal. No respiratory distress.  Musculoskeletal: Normal range of motion.  R knee: FROM. No effusion, erythema, rashes. L knee: trace effusion on L knee. FROM. No erythema or rash.    Neurological: He is alert and oriented to person, place, and time.  Skin: Skin is warm and dry.  Psychiatric: He has a normal mood and affect. His behavior is normal.  Nursing note and vitals reviewed.  Vitals:   03/16/18 1610  BP: 124/62  Pulse: (!) 59  Resp: 18  Temp: 98.2 F (36.8 C)  TempSrc: Oral  SpO2: 100%  Weight: 177 lb 9.6 oz (80.6 kg)  Height: 6' 0.75" (1.848 m)      Assessment & Plan:  David Frye is a 27 y.o. male HLA-B27 spondyloarthropathy  - commended on positive diet changes, but unknown definitive impact on disease process. Would recommend discussing diet approach with rheumatology as may be complimentary to the Humira. Also recommended he discuss overall long term treatment plan and options with rheumatology.  If second opinion sought, I am happy to refer him.   No orders of the defined types were placed in this encounter.  Patient Instructions   I would discuss diet effect on the the HLA B27 spondyloarthropathy, and options for meds with rheumatology. I am happy to refer you to another rheumatologist to get another opinion.  Let me know who you would like to see by Mychart and I will send in a referral.    IF you received an x-ray today, you will receive an invoice from [facility]. Please contact [facility] at [phone] with questions or concerns regarding your invoice.   IF you received labwork today, you will receive an invoice from [place]. Please contact [facility] at [phone] with questions or concerns regarding your invoice.   Our billing staff will not be able to assist you with questions regarding bills from these companies.  You will be contacted with the lab results as soon as they are available. The fastest way to get your results is to activate your My Chart account. Instructions are located on the last page of this paperwork. If you have not heard from [place] regarding the results in 2 weeks, please contact this  office.      I personally performed the services described in this documentation, which was scribed in my presence. The recorded information has been reviewed and considered for accuracy and completeness, addended by me as needed, and agree with information above.  Signed,   Merri Ray, MD Primary Care at [facility].  03/20/18 7:56 AM

## 2018-03-20 ENCOUNTER — Encounter: Payer: Self-pay | Admitting: Family Medicine

## 2018-10-21 ENCOUNTER — Encounter (HOSPITAL_COMMUNITY): Payer: Self-pay | Admitting: Emergency Medicine

## 2018-10-21 ENCOUNTER — Other Ambulatory Visit: Payer: Self-pay

## 2018-10-21 ENCOUNTER — Ambulatory Visit (HOSPITAL_COMMUNITY)
Admission: EM | Admit: 2018-10-21 | Discharge: 2018-10-21 | Disposition: A | Discharge status: Home / Self Care | Payer: BLUE CROSS/BLUE SHIELD | Attending: Family Medicine | Admitting: Family Medicine

## 2018-10-21 DIAGNOSIS — Z87891 Personal history of nicotine dependence: Secondary | ICD-10-CM | POA: Diagnosis not present

## 2018-10-21 DIAGNOSIS — M459 Ankylosing spondylitis of unspecified sites in spine: Secondary | ICD-10-CM | POA: Diagnosis not present

## 2018-10-21 DIAGNOSIS — J029 Acute pharyngitis, unspecified: Secondary | ICD-10-CM | POA: Insufficient documentation

## 2018-10-21 HISTORY — DX: Ankylosing spondylitis of unspecified sites in spine: M45.9

## 2018-10-21 LAB — POCT RAPID STREP A: STREPTOCOCCUS, GROUP A SCREEN (DIRECT): NEGATIVE

## 2018-10-21 NOTE — Discharge Instructions (Addendum)
Strep test negative, will send out for culture and we will call you with results Symptoms most likely viral in nature.   Get plenty of rest and push fluids Continue OTC medications as needed  Return or follow up with PCP if symptoms persists Return or go to ER if patient has any new or worsening symptoms fever, worsening pain, difficulty swallowing liquids or own saliva, nausea, vomiting, cough, chest pain, shortness of breath, etc..Marland Kitchen

## 2018-10-21 NOTE — ED Triage Notes (Addendum)
Congestion over the week end, sore throat since Monday.  Minimal cough or runny nose.  Reports congestion in throat.  General fatigue and headache  Patient is on humira.  Next dose due today and wants guidance if he should take or not.

## 2018-10-21 NOTE — ED Provider Notes (Signed)
[facility]   [id number] 10/21/18 Arrival Time: 0910  [id number] THROAT  SUBJECTIVE: History from: patient.  Talis Iwan is a 27 y.o. male who presents with sore throat x 3-4 days.  Denies positive sick exposure or precipitating event.  Has tried Asprin and hot tea with temporary relief.  Symptoms are made worse with swallowing, but tolerating liquids and own secretions.  Reports previous symptoms in the past and diagnosed with strep. Complains of fatigue, fever, and chills.  Denies sinus pain, rhinorrhea, cough, SOB, wheezing, chest pain, nausea, rash, changes in bowel or bladder habits.    Hx significant for ankylosing spondylitis taking humira injections.  ROS: As per HPI.  Past Medical History:  Diagnosis Date  . Ankylosing spondylitis (HCC)    History reviewed. No pertinent surgical history. No Known Allergies No current facility-administered medications on file prior to encounter.    Current Outpatient Medications on File Prior to Encounter  Medication Sig Dispense Refill  . HUMIRA PEN 40 MG/0.8ML PNKT   2   Social History   Socioeconomic History  . Marital status: Married    Spouse name: Not on file  . Number of children: Not on file  . Years of education: Not on file  . Highest education level: Not on file  Occupational History  . Occupation: Barista  . Financial resource strain: Not on file  . Food insecurity:    Worry: Not on file    Inability: Not on file  . Transportation needs:    Medical: Not on file    Non-medical: Not on file  Tobacco Use  . Smoking status: Former Games developer  . Smokeless tobacco: Never Used  Substance and Sexual Activity  . Alcohol use: Yes    Comment: 15 drinks a week  . Drug use: Yes    Types: Marijuana  . Sexual activity: Yes  Lifestyle  . Physical activity:    Days per week: Not on file    Minutes per session: Not on file  . Stress: Not on file  Relationships  . Social connections:   Talks on phone: Not on file    Gets together: Not on file    Attends religious service: Not on file    Active member of club or organization: Not on file    Attends meetings of clubs or organizations: Not on file    Relationship status: Not on file  . Intimate partner violence:    Fear of current or ex partner: Not on file    Emotionally abused: Not on file    Physically abused: Not on file    Forced sexual activity: Not on file  Other Topics Concern  . Not on file  Social History Narrative   Single. Education: [employer]. Exercise: Biking.   History reviewed. No pertinent family history.  OBJECTIVE:  Vitals:   10/21/18 0939  BP: (!) 116/59  Pulse: 66  Resp: 16  Temp: 99.3 F (37.4 C)  TempSrc: Oral  SpO2: 100%     General appearance: alert; nontoxic; mildly fatigue appearing HEENT: NCAT; Ears: EACs clear, TMs pearly gray; Eyes: PERRL.  EOM grossly intact.  Nose: no clear rhinorrhea; tonsils nonerythematous or enlarged, uvula midline  Neck: supple without LAD Lungs: CTA bilaterally without adventitious breath sounds Heart: regular rate and rhythm.  Radial pulses 2+ symmetrical bilaterally Skin: warm and dry Psychological: alert and cooperative; normal mood and affect  Labs: Results for orders placed or performed during the hospital encounter  of 10/21/18 (from the past 24 hour(s))  POCT rapid strep A [facility])     Status: None   Collection Time: 10/21/18 10:10 AM  Result Value Ref Range   Streptococcus, Group A Screen (Direct) NEGATIVE NEGATIVE     ASSESSMENT & PLAN:  1. Viral pharyngitis     No orders of the defined types were placed in this encounter.  Strep test negative, will send out for culture and we will call you with results Symptoms most likely viral in nature.   Get plenty of rest and push fluids Continue OTC medications as needed  Return or follow up with PCP if symptoms persists Return or go to ER if patient has any new or worsening  symptoms fever, worsening pain, difficulty swallowing liquids or own saliva, nausea, vomiting, cough, chest pain, shortness of breath, etc...  Reviewed expectations re: course of current medical issues. Questions answered. Outlined signs and symptoms indicating need for more acute intervention. Patient verbalized understanding. After Visit Summary given.        Rennis Harding, PA-C 10/21/18 1046

## 2018-10-22 ENCOUNTER — Ambulatory Visit: Payer: BLUE CROSS/BLUE SHIELD | Admitting: Family Medicine

## 2018-10-23 LAB — CULTURE, GROUP A STREP (THRC)

## 2020-02-18 ENCOUNTER — Other Ambulatory Visit: Payer: Self-pay

## 2020-02-18 ENCOUNTER — Telehealth (INDEPENDENT_AMBULATORY_CARE_PROVIDER_SITE_OTHER): Payer: BC Managed Care – PPO | Admitting: Family Medicine

## 2020-02-18 VITALS — Ht 72.0 in | Wt 175.0 lb

## 2020-02-18 DIAGNOSIS — R1031 Right lower quadrant pain: Secondary | ICD-10-CM | POA: Diagnosis not present

## 2020-02-18 NOTE — Patient Instructions (Addendum)
Good talking to you today.  Based on your description I do suspect that the area involved could be a lymph node or reactive lymph node.  As it is somewhat improved today, I think it is reasonable to follow-up with me in office on Monday for further exam and testing at that time.  If any worsening symptoms over the weekend, I would recommend evaluation through urgent care or emergency room if needed.  We will call you about the appointment on Monday and will try to have that done late in the morning.

## 2020-02-18 NOTE — Progress Notes (Signed)
Virtual Visit via Video Note  I connected with David Frye on 02/18/20 at 11:15 AM by a video enabled telemedicine application [employer] and verified that I am speaking with the correct person using two identifiers.   I discussed the limitations, risks, security and privacy concerns of performing an evaluation and management service by telephone and the availability of in person appointments. I also discussed with the patient that there may be a patient responsible charge related to this service. The patient expressed understanding and agreed to proceed, consent obtained  Chief complaint:  Chief Complaint  Patient presents with  . Groin Pain    pain started on tuesday. no knowen triger for pain. pt states he see a lump that is hard and tender, pt states he thinks it's a Lyphnod.      History of Present Illness: David Frye is a 29 y.o. male  Groin pain Started 3 days ago, noted near R hip crease 2 inches below belt line. Tender/sore in that area - hard bump in that area. Slightly smaller than grape.  Same past few days, may be less tender today - decreasing tenderness.  No injury. No changfe with lifting/straining or BM.  No fever/chills. No night sweats, no unexplained weight loss.  No penile rash, ulcers, or penile discharge.  New sexual contact no unprotected intercourse, has received unprotected oral sex only- new partner past 3 months, no other partners. No hx of STI's.  Similar sx's in same area few months ago - resolved in few days.   Notes he has had a hard lump on side of neck - noted in 2016. Had PCP eval at that time - not concerned.  No changes, but remained hard.   History of ankylosing spondylitis, followed by Dr. Nickola Major with [facility] rheumatology, treated with Humira.  There are no problems to display for this patient.  Past Medical History:  Diagnosis Date  . Ankylosing spondylitis (HCC)    No past surgical history on file. No Known  Allergies Prior to Admission medications   Medication Sig Start Date End Date Taking? Authorizing Provider  HUMIRA PEN 40 MG/0.8ML PNKT  02/25/18  Yes [provider]   Social History   Socioeconomic History  . Marital status: Married    Spouse name: Not on file  . Number of children: Not on file  . Years of education: Not on file  . Highest education level: Not on file  Occupational History  . Occupation: Banker  Tobacco Use  . Smoking status: Former Games developer  . Smokeless tobacco: Never Used  Substance and Sexual Activity  . Alcohol use: Yes    Comment: 15 drinks a week  . Drug use: Yes    Types: Marijuana  . Sexual activity: Yes  Other Topics Concern  . Not on file  Social History Narrative   Single. Education: [employer]. Exercise: Biking.   Social Determinants of Health   Financial Resource Strain:   . Difficulty of Paying Living Expenses: Not on file  Food Insecurity:   . Worried About Programme researcher, broadcasting/film/video in the Last Year: Not on file  . Ran Out of Food in the Last Year: Not on file  Transportation Needs:   . Lack of Transportation (Medical): Not on file  . Lack of Transportation (Non-Medical): Not on file  Physical Activity:   . Days of Exercise per Week: Not on file  . Minutes of Exercise per Session: Not on file  Stress:   . Feeling  of Stress : Not on file  Social Connections:   . Frequency of Communication with Friends and Family: Not on file  . Frequency of Social Gatherings with Friends and Family: Not on file  . Attends Religious Services: Not on file  . Active Member of Clubs or Organizations: Not on file  . Attends Archivist Meetings: Not on file  . Marital Status: Not on file  Intimate Partner Violence:   . Fear of Current or Ex-Partner: Not on file  . Emotionally Abused: Not on file  . Physically Abused: Not on file  . Sexually Abused: Not on file    Observations/Objective: Vitals:   02/18/20 1050  Weight: 175 lb  (79.4 kg)  Height: 6' (1.829 m)  No distress on video, appropriate responses, all questions answered with understanding of plan expressed.  Mood euthymic.  Nontoxic-appearing.   Assessment and Plan: Right groin pain  -Based on description appears to be possible lymphadenopathy, less likely hernia.  Fairly recent symptoms, likely reactive lymphadenopathy.  No STI symptoms, no previous STI history.  Denies ulcerations or wounds, less likely LGV.  Reports similar symptoms 2 months ago that resolved on their own.  Currently is experiencing less discomfort.  He does take Humira, and is concerned about possible lymphoma.  -Plan for in office exam in 3 days, held on specific treatments at this time until evaluated in person as his symptoms have been improving.  Likely will check CBC, STI testing, as well as exam to evaluate for other lymphadenopathy.  ER/urgent care precautions given over the weekend if any worsening.  Follow Up Instructions:   3 days in office. I discussed the assessment and treatment plan with the patient. The patient was provided an opportunity to ask questions and all were answered. The patient agreed with the plan and demonstrated an understanding of the instructions.   The patient was advised to call back or seek an in-person evaluation if the symptoms worsen or if the condition fails to improve as anticipated.  I provided 20 minutes of non-face-to-face time during this encounter.   Wendie Agreste, MD

## 2020-02-18 NOTE — Progress Notes (Signed)
Patient has been scheduled for 02/21/20 on 10:20 AM per Dr. Neva Seat.

## 2020-02-21 ENCOUNTER — Other Ambulatory Visit (HOSPITAL_COMMUNITY)
Admission: RE | Admit: 2020-02-21 | Discharge: 2020-02-21 | Disposition: A | Discharge status: Home / Self Care | Payer: BC Managed Care – PPO | Source: Ambulatory Visit | Attending: Family Medicine | Admitting: Family Medicine

## 2020-02-21 ENCOUNTER — Ambulatory Visit (INDEPENDENT_AMBULATORY_CARE_PROVIDER_SITE_OTHER): Payer: BC Managed Care – PPO | Admitting: Family Medicine

## 2020-02-21 ENCOUNTER — Other Ambulatory Visit: Payer: Self-pay

## 2020-02-21 ENCOUNTER — Encounter: Payer: Self-pay | Admitting: Family Medicine

## 2020-02-21 VITALS — BP 128/76 | HR 62 | Temp 98.3°F | Ht 72.0 in | Wt 176.0 lb

## 2020-02-21 DIAGNOSIS — Z79899 Other long term (current) drug therapy: Secondary | ICD-10-CM

## 2020-02-21 DIAGNOSIS — R590 Localized enlarged lymph nodes: Secondary | ICD-10-CM | POA: Diagnosis not present

## 2020-02-21 DIAGNOSIS — Z113 Encounter for screening for infections with a predominantly sexual mode of transmission: Secondary | ICD-10-CM | POA: Diagnosis not present

## 2020-02-21 NOTE — Patient Instructions (Signed)
   Thanks for coming in today.  I will check some blood work as well as STI testing but I am glad to hear that it is improving.  If it does not continue to improve or resolve within the next 2 weeks, return for recheck to decide on ultrasound or other evaluation. Return to the clinic or go to the nearest emergency room if any of your symptoms worsen or new symptoms occur.    If you have lab work done today you will be contacted with your lab results within the next 2 weeks.  If you have not heard from [place] then please contact us. The fastest way to get your results is to register for My Chart.   IF you received an x-ray today, you will receive an invoice from [facility]. Please contact [facility] at [phone] with questions or concerns regarding your invoice.   IF you received labwork today, you will receive an invoice from [place]. Please contact [facility] at [phone] with questions or concerns regarding your invoice.   Our billing staff will not be able to assist you with questions regarding bills from these companies.  You will be contacted with the lab results as soon as they are available. The fastest way to get your results is to activate your My Chart account. Instructions are located on the last page of this paperwork. If you have not heard from [place] regarding the results in 2 weeks, please contact this office.    ;

## 2020-02-21 NOTE — Progress Notes (Signed)
Subjective:  Patient ID: David Frye, male    DOB: 1991/08/24  Age: 29 y.o. MRN: [id number]  CC:  Chief Complaint  Patient presents with  . Groin Pain    R groin area bump- tender initially starting 1 week ago , routinely does yoga and rock climbing    HPI David Frye presents for   R groin pain: See telemed visit last week.  Started 3 days prior at that time, noted his right hip clicks.  Tenderness/soreness with slightly less soreness on video virtual visit, February 19.  Single new sexual partner past 3 months but no other partners, no unprotected intercourse. Also noted a hard lump on the side of his neck in the past 4 to 5 years without apparent changes.  He does have a history of ankylosing spondylitis, treated with Humira. Last bloodwork in past 6 months to a year.   Area is less sore, maybe smaller. No fever, no penile d/c, no testicular pain.  No FH of blood dyscrasias/malignancy, specifically no lymphoma/leukemia.         History There are no problems to display for this patient.  Past Medical History:  Diagnosis Date  . Ankylosing spondylitis ([place])    History reviewed. No pertinent surgical history. No Known Allergies Prior to Admission medications   Medication Sig Start Date End Date Taking? Authorizing Provider  HUMIRA PEN 40 MG/0.4ML PNKT  02/08/20   [provider]  HUMIRA PEN 40 MG/0.8ML PNKT  02/25/18   [provider]   Social History   Socioeconomic History  . Marital status: Married    Spouse name: Not on file  . Number of children: Not on file  . Years of education: Not on file  . Highest education level: Not on file  Occupational History  . Occupation: Human resources officer  Tobacco Use  . Smoking status: Former Research scientist (life sciences)  . Smokeless tobacco: Never Used  Substance and Sexual Activity  . Alcohol use: Yes    Comment: 15 drinks a week  . Drug use: Yes    Types: Marijuana  . Sexual activity: Yes  Other Topics Concern    . Not on file  Social History Narrative   Single. Education: [employer]. Exercise: Biking.   Social Determinants of Health   Financial Resource Strain:   . Difficulty of Paying Living Expenses: Not on file  Food Insecurity:   . Worried About Charity fundraiser in the Last Year: Not on file  . Ran Out of Food in the Last Year: Not on file  Transportation Needs:   . Lack of Transportation (Medical): Not on file  . Lack of Transportation (Non-Medical): Not on file  Physical Activity:   . Days of Exercise per Week: Not on file  . Minutes of Exercise per Session: Not on file  Stress:   . Feeling of Stress : Not on file  Social Connections:   . Frequency of Communication with Friends and Family: Not on file  . Frequency of Social Gatherings with Friends and Family: Not on file  . Attends Religious Services: Not on file  . Active Member of Clubs or Organizations: Not on file  . Attends Archivist Meetings: Not on file  . Marital Status: Not on file  Intimate Partner Violence:   . Fear of Current or Ex-Partner: Not on file  . Emotionally Abused: Not on file  . Physically Abused: Not on file  . Sexually Abused: Not on file  Review of Systems  Per HPi.   Objective:   Vitals:   02/21/20 1110  BP: 128/76  Pulse: 62  Temp: 98.3 F (36.8 C)  TempSrc: Temporal  SpO2: 99%  Weight: 176 lb (79.8 kg)  Height: 6' (1.829 m)     Physical Exam Vitals reviewed.  Constitutional:      General: He is not in acute distress.    Appearance: He is well-developed.  HENT:     Head: Normocephalic and atraumatic.   Cardiovascular:     Rate and Rhythm: Normal rate.  Pulmonary:     Effort: Pulmonary effort is normal.  Abdominal:     General: Abdomen is flat.     Palpations: There is no hepatomegaly or splenomegaly.     Tenderness: There is no abdominal tenderness.  Genitourinary:    Penis: Normal. No discharge, swelling or lesions.      Testes: Normal.        Right:  Mass or tenderness not present.        Left: Mass or tenderness not present.     Epididymis:     Right: Normal.     Left: Normal.  Lymphadenopathy:     Head:     Right side of head: No submental, submandibular, tonsillar, preauricular, posterior auricular or occipital adenopathy.     Left side of head: No submental, submandibular, tonsillar, preauricular, posterior auricular or occipital adenopathy.     Cervical: No cervical adenopathy.     Upper Body:     Right upper body: No supraclavicular, axillary or epitrochlear adenopathy.     Left upper body: No supraclavicular, axillary or epitrochlear adenopathy.     Lower Body: Right inguinal adenopathy (1-1.5cm node, nontender. ) present. No left inguinal adenopathy.  Neurological:     Mental Status: He is alert and oriented to person, place, and time.        Assessment & Plan:  David Frye is a 29 y.o. male . Lymphadenopathy, inguinal - Plan: CBC with Differential/Platelet  High risk medication use - Plan: CBC with Differential/Platelet  Routine screening for STI (sexually transmitted infection) - Plan: GC/Chlamydia probe amp ([place])not at [facility], HIV Antibody (routine testing w rflx), RPR  Possible reactive inguinal lymphadenopathy on the right only.  I did not appreciate any lymphadenopathy on exam, nor hepatosplenomegaly.  Symptoms are improving, no concerning findings on exam  He does take Humira, will check CBC, have close follow-up in 2 weeks if area is not improving and consider ultrasound/imaging at that time.  RTC precautions if worse  -STI testing as above, but less likely.  -RTC precautions No orders of the defined types were placed in this encounter.  Patient Instructions     Thanks for coming in today.  I will check some blood work as well as STI testing but I am glad to hear that it is improving.  If it does not continue to improve or resolve within the next 2 weeks, return for recheck to decide on  ultrasound or other evaluation. Return to the clinic or go to the nearest emergency room if any of your symptoms worsen or new symptoms occur.    If you have lab work done today you will be contacted with your lab results within the next 2 weeks.  If you have not heard from [place] then please contact us. The fastest way to get your results is to register for My Chart.   IF you received an x-ray today, you will  receive an Economist from [facility]. Please contact [facility] at [phone] with questions or concerns regarding your invoice.   IF you received labwork today, you will receive an invoice from [place]. Please contact [facility] at [phone] with questions or concerns regarding your invoice.   Our billing staff will not be able to assist you with questions regarding bills from these companies.  You will be contacted with the lab results as soon as they are available. The fastest way to get your results is to activate your My Chart account. Instructions are located on the last page of this paperwork. If you have not heard from [place] regarding the results in 2 weeks, please contact this office.    ;     Signed, Meredith Staggers, MD [facility]

## 2020-02-22 LAB — CBC WITH DIFFERENTIAL/PLATELET
Basophils Absolute: 0.1 10*3/uL (ref 0.0–0.2)
Basos: 1 %
EOS (ABSOLUTE): 0.2 10*3/uL (ref 0.0–0.4)
Eos: 3 %
Hematocrit: 44.5 % (ref 37.5–51.0)
Hemoglobin: 15.3 g/dL (ref 13.0–17.7)
Immature Grans (Abs): 0 10*3/uL (ref 0.0–0.1)
Immature Granulocytes: 0 %
Lymphocytes Absolute: 2.1 10*3/uL (ref 0.7–3.1)
Lymphs: 30 %
MCH: 30.3 pg (ref 26.6–33.0)
MCHC: 34.4 g/dL (ref 31.5–35.7)
MCV: 88 fL (ref 79–97)
Monocytes Absolute: 0.7 10*3/uL (ref 0.1–0.9)
Monocytes: 11 %
Neutrophils Absolute: 3.7 10*3/uL (ref 1.4–7.0)
Neutrophils: 55 %
Platelets: 222 10*3/uL (ref 150–450)
RBC: 5.05 x10E6/uL (ref 4.14–5.80)
RDW: 11.7 % (ref 11.6–15.4)
WBC: 6.8 10*3/uL (ref 3.4–10.8)

## 2020-02-22 LAB — HIV ANTIBODY (ROUTINE TESTING W REFLEX): HIV Screen 4th Generation wRfx: NONREACTIVE

## 2020-02-22 LAB — RPR: RPR Ser Ql: NONREACTIVE

## 2020-02-23 LAB — GC/CHLAMYDIA PROBE AMP (~~LOC~~) NOT AT ARMC
Chlamydia: NEGATIVE
Comment: NEGATIVE
Comment: NORMAL
Neisseria Gonorrhea: NEGATIVE

## 2020-02-24 NOTE — Progress Notes (Signed)
Spoke to pt, all is well

## 2020-05-23 ENCOUNTER — Encounter: Payer: Self-pay | Admitting: Registered Nurse

## 2020-05-23 ENCOUNTER — Ambulatory Visit (INDEPENDENT_AMBULATORY_CARE_PROVIDER_SITE_OTHER): Payer: BC Managed Care – PPO

## 2020-05-23 ENCOUNTER — Ambulatory Visit (INDEPENDENT_AMBULATORY_CARE_PROVIDER_SITE_OTHER): Payer: BC Managed Care – PPO | Admitting: Registered Nurse

## 2020-05-23 ENCOUNTER — Other Ambulatory Visit: Payer: Self-pay

## 2020-05-23 VITALS — BP 131/70 | HR 75 | Temp 98.0°F | Resp 16 | Ht 73.0 in | Wt 175.0 lb

## 2020-05-23 DIAGNOSIS — M25571 Pain in right ankle and joints of right foot: Secondary | ICD-10-CM

## 2020-05-23 DIAGNOSIS — S99911A Unspecified injury of right ankle, initial encounter: Secondary | ICD-10-CM

## 2020-05-23 MED ORDER — MELOXICAM 15 MG PO TABS: 15.0000 mg | ORAL_TABLET | Freq: Every day | ORAL | 0 refills | Status: AC

## 2020-05-23 MED ORDER — TRAMADOL HCL 50 MG PO TABS: 50.0000 mg | ORAL_TABLET | Freq: Three times a day (TID) | ORAL | 0 refills | Status: AC | PRN

## 2020-05-23 NOTE — Progress Notes (Signed)
Acute Office Visit  Subjective:    Patient ID: David Frye, male    DOB: September 21, 1991, 29 y.o.   MRN: [id number]  Chief Complaint  Patient presents with  . Ankle Pain    patient states he went to hike yesterday and fell a couple of feet before landing and ankle hit a rock . Ankle is very swollen that he can not wear a shoe and bruises up on the outside. Per patient he has took some advil for pain. Patient states it only hurt if he step down on foot.    HPI Patient is in today for ankle pain  Was hiking, slipped and fell a few feet yesterday and landed on his R foot. No snap or cracking sound when this happened. Unsure if foot inverted or everted. No history of injury to this extremity.   Notes now it is swollen, tender, and difficult to bear weight. Hard to put shoe on due to pain and swelling. Bruising across most of ankle. Worst of swelling and bruising is surrounding medial malleolus. Greatest area of tenderness is anterolateral aspect of joint.   No numbness in toes. No radiation of pain up leg or down to toes. No redness, swelling, heat suggestive of DVT.   Past Medical History:  Diagnosis Date  . Ankylosing spondylitis ([place])     No past surgical history on file.  No family history on file.  Social History   Socioeconomic History  . Marital status: Married    Spouse name: Not on file  . Number of children: Not on file  . Years of education: Not on file  . Highest education level: Not on file  Occupational History  . Occupation: Human resources officer  Tobacco Use  . Smoking status: Former Research scientist (life sciences)  . Smokeless tobacco: Never Used  Substance and Sexual Activity  . Alcohol use: Yes    Comment: 15 drinks a week  . Drug use: Yes    Types: Marijuana  . Sexual activity: Yes  Other Topics Concern  . Not on file  Social History Narrative   Single. Education: [employer]. Exercise: Biking.   Social Determinants of Health   Financial Resource Strain:   . Difficulty of  Paying Living Expenses:   Food Insecurity:   . Worried About Charity fundraiser in the Last Year:   . Arboriculturist in the Last Year:   Transportation Needs:   . Film/video editor (Medical):   Marland Kitchen Lack of Transportation (Non-Medical):   Physical Activity:   . Days of Exercise per Week:   . Minutes of Exercise per Session:   Stress:   . Feeling of Stress :   Social Connections:   . Frequency of Communication with Friends and Family:   . Frequency of Social Gatherings with Friends and Family:   . Attends Religious Services:   . Active Member of Clubs or Organizations:   . Attends Archivist Meetings:   Marland Kitchen Marital Status:   Intimate Partner Violence:   . Fear of Current or Ex-Partner:   . Emotionally Abused:   Marland Kitchen Physically Abused:   . Sexually Abused:     Outpatient Medications Prior to Visit  Medication Sig Dispense Refill  . HUMIRA PEN 40 MG/0.4ML PNKT     . HUMIRA PEN 40 MG/0.8ML PNKT   2   No facility-administered medications prior to visit.    No Known Allergies  Review of Systems Per hpi, otherwise negative.  Objective:    Physical Exam Vitals and nursing note reviewed.  Constitutional:      Appearance: Normal appearance. He is normal weight.  HENT:     Head: Normocephalic and atraumatic.  Cardiovascular:     Rate and Rhythm: Normal rate and regular rhythm.  Pulmonary:     Effort: Pulmonary effort is normal. No respiratory distress.  Musculoskeletal:        General: Swelling, tenderness and signs of injury present. No deformity.     Right lower leg: Edema present.     Left lower leg: No edema.  Skin:    General: Skin is warm and dry.     Capillary Refill: Capillary refill takes less than 2 seconds.     Coloration: Skin is not jaundiced or pale.     Findings: Bruising present. No erythema, lesion or rash.  Neurological:     General: No focal deficit present.     Mental Status: He is alert and oriented to person, place, and time.  Mental status is at baseline.     Cranial Nerves: No cranial nerve deficit.  Psychiatric:        Mood and Affect: Mood normal.        Behavior: Behavior normal.        Thought Content: Thought content normal.        Judgment: Judgment normal.     BP 131/70   Pulse 75   Temp 98 F (36.7 C) (Temporal)   Resp 16   Ht 6\' 1"  (1.854 m)   Wt 175 lb (79.4 kg)   SpO2 97%   BMI 23.09 kg/m  Wt Readings from Last 3 Encounters:  05/23/20 175 lb (79.4 kg)  02/21/20 176 lb (79.8 kg)  02/18/20 175 lb (79.4 kg)    Health Maintenance Due  Topic Date Due  . COVID-19 Vaccine (1) Never done    There are no preventive care reminders to display for this patient.   No results found for: TSH Lab Results  Component Value Date   WBC 6.8 02/21/2020   HGB 15.3 02/21/2020   HCT 44.5 02/21/2020   MCV 88 02/21/2020   PLT 222 02/21/2020   Lab Results  Component Value Date   NA 140 03/11/2017   K 4.2 03/11/2017   CO2 26 03/11/2017   GLUCOSE 79 03/11/2017   BUN 11 03/11/2017   CREATININE 0.76 03/11/2017   BILITOT 0.4 03/11/2017   ALKPHOS 85 03/11/2017   AST 9 03/11/2017   ALT 10 03/11/2017   PROT 7.5 03/11/2017   ALBUMIN 4.6 03/11/2017   CALCIUM 9.7 03/11/2017   No results found for: CHOL No results found for: HDL No results found for: LDLCALC No results found for: TRIG No results found for: CHOLHDL No results found for: 03/13/2017     Assessment & Plan:   Problem List Items Addressed This Visit    None    Visit Diagnoses    Injury of right ankle, initial encounter    -  Primary   Relevant Medications   meloxicam (MOBIC) 15 MG tablet   traMADol (ULTRAM) 50 MG tablet   Other Relevant Orders   DG Foot Complete Right (Completed)   DG Ankle Complete Right (Completed)       Meds ordered this encounter  Medications  . meloxicam (MOBIC) 15 MG tablet    Sig: Take 1 tablet (15 mg total) by mouth daily.    Dispense:  30 tablet    Refill:  0    Order Specific Question:    Supervising Provider    Answer:   Collie Siad A [id number]  . traMADol (ULTRAM) 50 MG tablet    Sig: Take 1 tablet (50 mg total) by mouth every 8 (eight) hours as needed for up to 5 days.    Dispense:  15 tablet    Refill:  0    Order Specific Question:   Supervising Provider    Answer:   Doristine Bosworth [id number]   PLAN  xrays show no fracture  Given area of tenderness and limited ROM as well as mechanism of injury, appears as severe ankle sprain  Given air cast, RICE, meloxicam and tramadol prn  Patient encouraged to call clinic with any questions, comments, or concerns.   Janeece Agee, NP

## 2020-05-23 NOTE — Patient Instructions (Signed)
° ° ° °  If you have lab work done today you will be contacted with your lab results within the next 2 weeks.  If you have not heard from us then please contact us. The fastest way to get your results is to register for My Chart. ° ° °IF you received an x-ray today, you will receive an invoice from [facility]. Please contact [facility] at [phone] with questions or concerns regarding your invoice.  ° °IF you received labwork today, you will receive an invoice from [facility]. Please contact [facility] at [phone] with questions or concerns regarding your invoice.  ° °Our billing staff will not be able to assist you with questions regarding bills from these companies. ° °You will be contacted with the lab results as soon as they are available. The fastest way to get your results is to activate your My Chart account. Instructions are located on the last page of this paperwork. If you have not heard from us regarding the results in 2 weeks, please contact this office. °  ° ° ° °

## 2020-08-24 ENCOUNTER — Other Ambulatory Visit: Payer: Self-pay

## 2021-05-17 IMAGING — DX DG ANKLE COMPLETE 3+V*R*
4 series · 4 of 4 positions shown · non-contrast
Comparison: None.

CLINICAL DATA: Acute foot pain

EXAM:
RIGHT ANKLE - COMPLETE 3+ VIEW

[ankle ap]
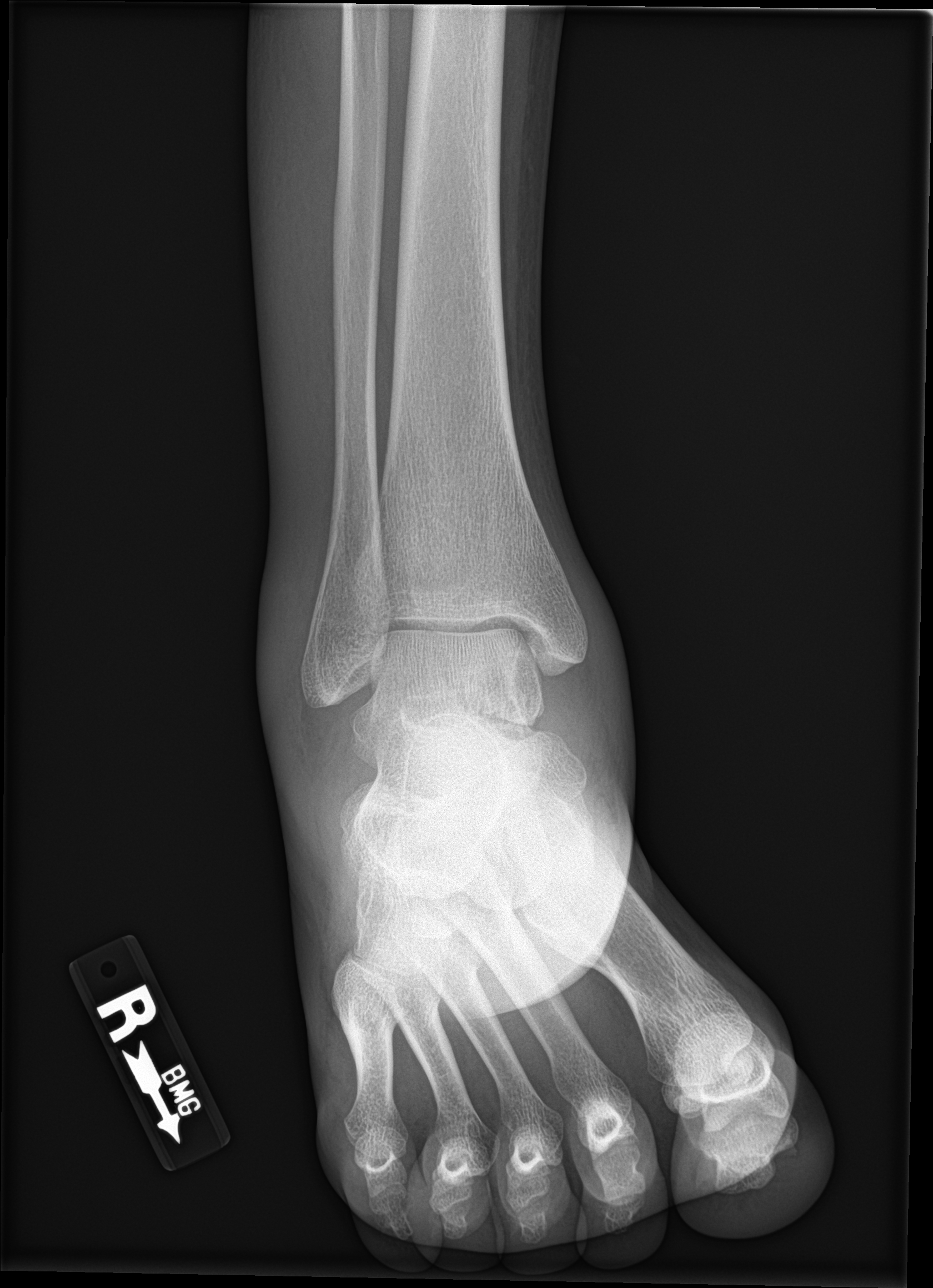

[ankle obl (1 of 2)]
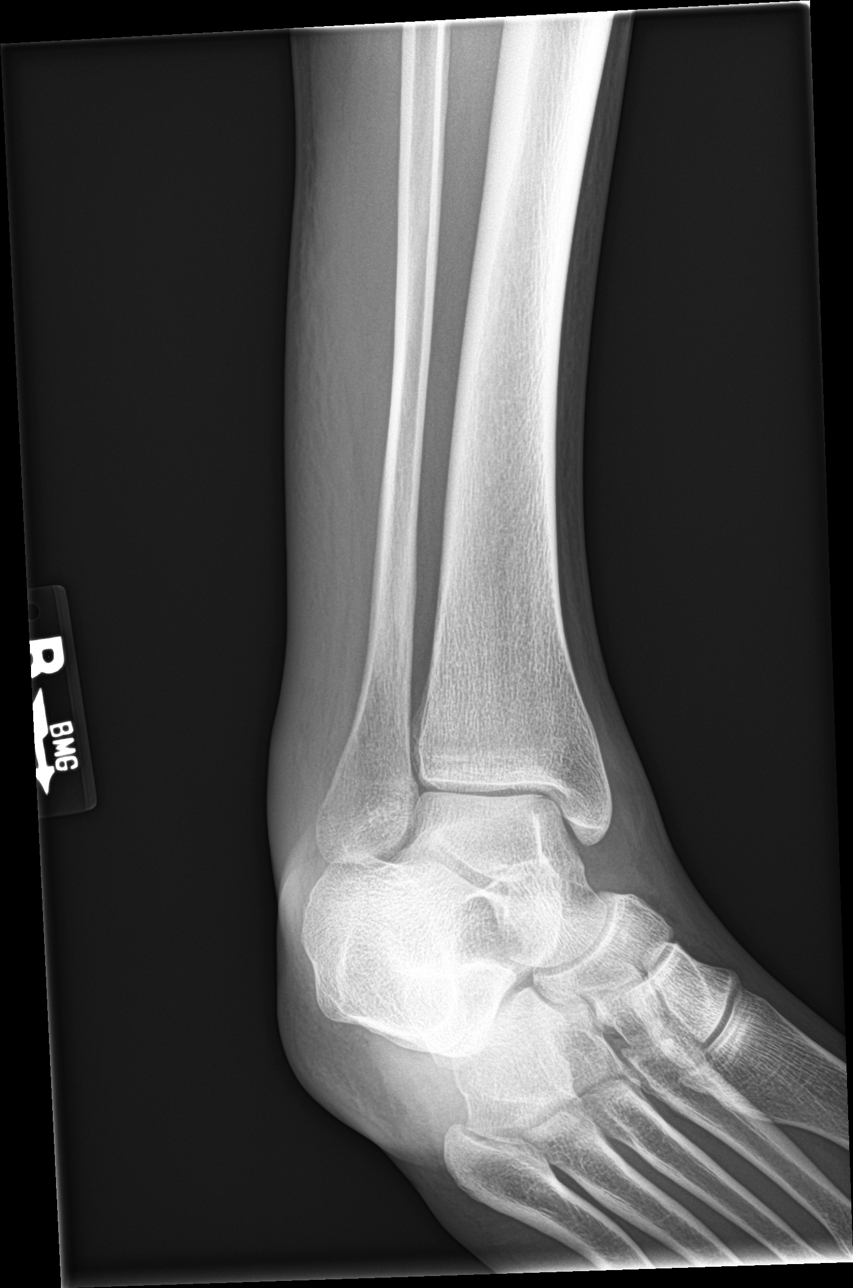

[ankle lat]
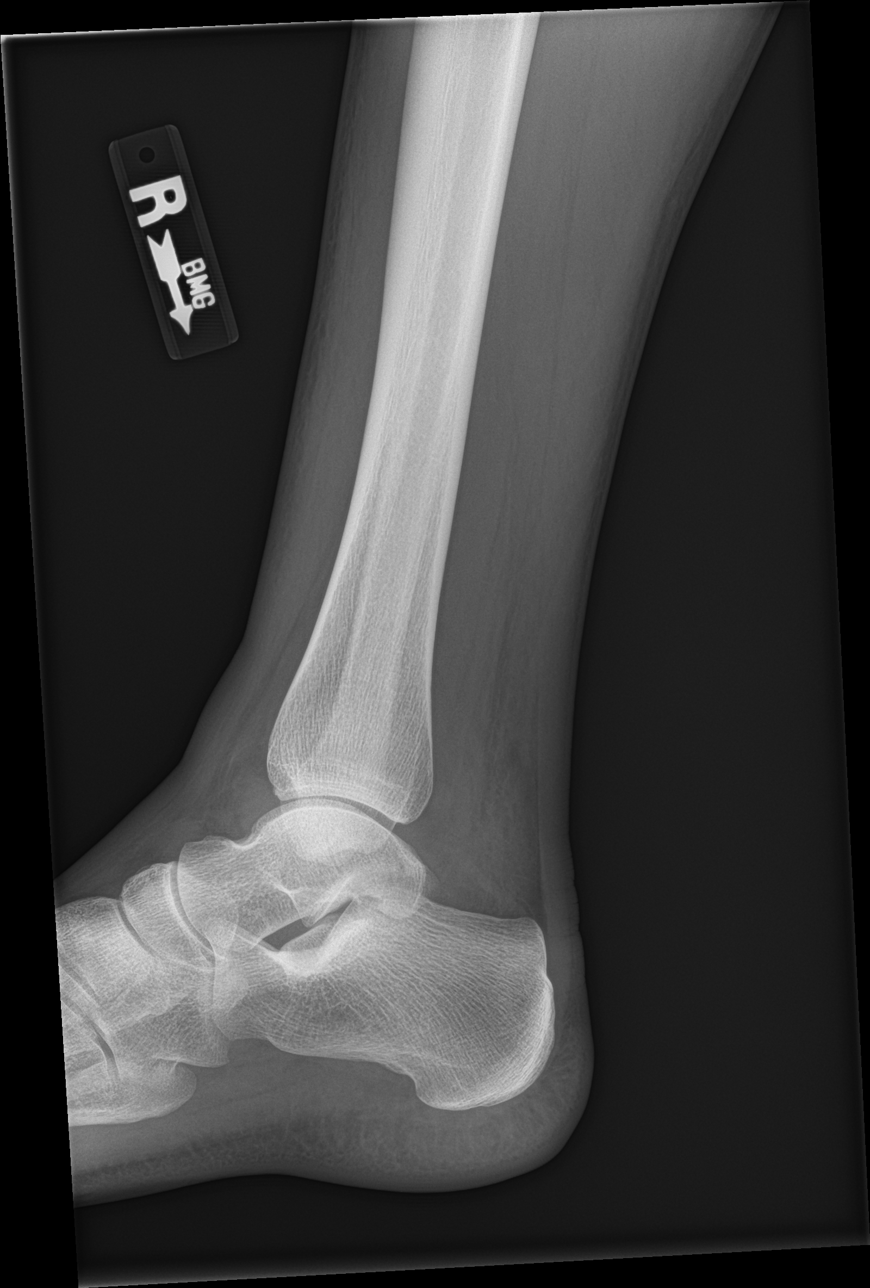

[ankle obl (2 of 2)]
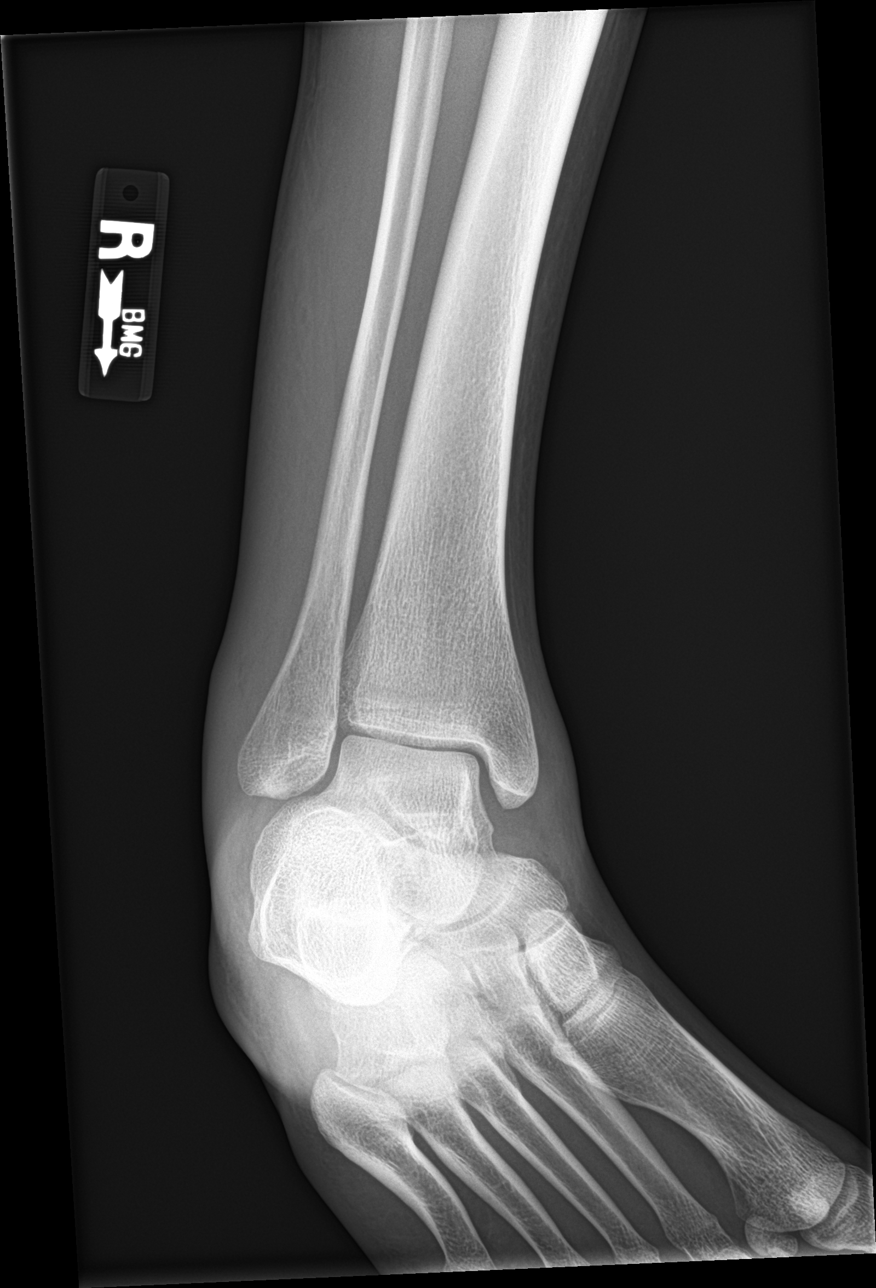

[4 of 4 positions shown; findings below may reference images not displayed]

FINDINGS: Normal ankle mortise. No fracture or degenerative change. Diffuse
soft tissue swelling.
IMPRESSION: Negative for fracture.

## 2021-05-24 ENCOUNTER — Encounter: Payer: Self-pay | Admitting: Registered Nurse
